# Patient Record
Sex: Female | Born: 1935 | Race: White | Hispanic: No | Smoking: Former smoker
Health system: Southern US, Community
[De-identification: ages and names within clinical notes are randomized; demographics above are authoritative.]

## PROBLEM LIST (undated history)

## (undated) DIAGNOSIS — C4492 Squamous cell carcinoma of skin, unspecified: Secondary | ICD-10-CM

## (undated) DIAGNOSIS — I701 Atherosclerosis of renal artery: Secondary | ICD-10-CM

## (undated) DIAGNOSIS — A412 Sepsis due to unspecified staphylococcus: Secondary | ICD-10-CM

## (undated) DIAGNOSIS — Z86711 Personal history of pulmonary embolism: Secondary | ICD-10-CM

## (undated) DIAGNOSIS — I38 Endocarditis, valve unspecified: Secondary | ICD-10-CM

## (undated) DIAGNOSIS — I272 Pulmonary hypertension, unspecified: Secondary | ICD-10-CM

## (undated) DIAGNOSIS — N189 Chronic kidney disease, unspecified: Secondary | ICD-10-CM

## (undated) DIAGNOSIS — I2510 Atherosclerotic heart disease of native coronary artery without angina pectoris: Secondary | ICD-10-CM

## (undated) DIAGNOSIS — E039 Hypothyroidism, unspecified: Secondary | ICD-10-CM

## (undated) DIAGNOSIS — I10 Essential (primary) hypertension: Secondary | ICD-10-CM

## (undated) DIAGNOSIS — E785 Hyperlipidemia, unspecified: Secondary | ICD-10-CM

## (undated) DIAGNOSIS — M353 Polymyalgia rheumatica: Secondary | ICD-10-CM

## (undated) DIAGNOSIS — I4891 Unspecified atrial fibrillation: Secondary | ICD-10-CM

## (undated) DIAGNOSIS — I5032 Chronic diastolic (congestive) heart failure: Secondary | ICD-10-CM

## (undated) HISTORY — DX: Atherosclerotic heart disease of native coronary artery without angina pectoris: I25.10

## (undated) HISTORY — DX: Pulmonary hypertension, unspecified: I27.20

## (undated) HISTORY — DX: Polymyalgia rheumatica: M35.3

## (undated) HISTORY — DX: Hyperlipidemia, unspecified: E78.5

## (undated) HISTORY — DX: Chronic diastolic (congestive) heart failure: I50.32

## (undated) HISTORY — DX: Chronic kidney disease, unspecified: N18.9

## (undated) HISTORY — DX: Endocarditis, valve unspecified: I38

## (undated) HISTORY — DX: Personal history of pulmonary embolism: Z86.711

## (undated) HISTORY — DX: Hypothyroidism, unspecified: E03.9

## (undated) HISTORY — DX: Atherosclerosis of renal artery: I70.1

## (undated) HISTORY — DX: Unspecified atrial fibrillation: I48.91

## (undated) HISTORY — DX: Sepsis due to unspecified Staphylococcus: A41.2

## (undated) HISTORY — DX: Squamous cell carcinoma of skin, unspecified: C44.92

## (undated) HISTORY — DX: Essential (primary) hypertension: I10

---
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
## 2005-03-01 ENCOUNTER — Emergency Department (HOSPITAL_COMMUNITY): Admission: EM | Admit: 2005-03-01 | Discharge: 2005-03-01 | Payer: Self-pay | Admitting: Emergency Medicine

## 2005-03-06 ENCOUNTER — Inpatient Hospital Stay (HOSPITAL_COMMUNITY): Admission: EM | Admit: 2005-03-06 | Discharge: 2005-04-16 | Payer: Self-pay | Admitting: Emergency Medicine

## 2005-03-07 ENCOUNTER — Encounter (INDEPENDENT_AMBULATORY_CARE_PROVIDER_SITE_OTHER): Payer: Self-pay | Admitting: Interventional Cardiology

## 2005-03-08 ENCOUNTER — Ambulatory Visit: Payer: Self-pay | Admitting: Internal Medicine

## 2005-03-09 ENCOUNTER — Ambulatory Visit: Payer: Self-pay | Admitting: Infectious Diseases

## 2005-03-13 ENCOUNTER — Ambulatory Visit: Payer: Self-pay | Admitting: Emergency Medicine

## 2005-04-29 ENCOUNTER — Emergency Department (HOSPITAL_COMMUNITY): Admission: EM | Admit: 2005-04-29 | Discharge: 2005-04-29 | Payer: Self-pay | Admitting: Emergency Medicine

## 2005-05-28 ENCOUNTER — Encounter: Admission: RE | Admit: 2005-05-28 | Discharge: 2005-05-28 | Payer: Self-pay | Admitting: Family Medicine

## 2005-06-04 ENCOUNTER — Ambulatory Visit: Payer: Self-pay | Admitting: Cardiology

## 2005-06-07 ENCOUNTER — Ambulatory Visit: Payer: Self-pay | Admitting: Cardiology

## 2005-06-22 ENCOUNTER — Ambulatory Visit: Payer: Self-pay | Admitting: Cardiology

## 2005-06-27 ENCOUNTER — Ambulatory Visit: Payer: Self-pay | Admitting: Cardiovascular Disease

## 2005-07-04 ENCOUNTER — Ambulatory Visit: Payer: Self-pay | Admitting: *Deleted

## 2005-07-11 ENCOUNTER — Ambulatory Visit: Payer: Self-pay | Admitting: Cardiology

## 2005-07-25 ENCOUNTER — Ambulatory Visit: Payer: Self-pay | Admitting: Cardiology

## 2005-08-08 ENCOUNTER — Ambulatory Visit: Payer: Self-pay | Admitting: Internal Medicine

## 2006-09-28 IMAGING — CR DG ABDOMEN ACUTE W/ 1V CHEST
3 series · 3 of 3 positions shown · non-contrast
Comparison: 03/15/05 chest and 03/16/05 abdomen.

CLINICAL DATA: Abdominal distention.  Sepsis.  Short of breath. 
 ACUTE ABDOMINAL SERIES:

[view not recorded (1 of 3)]
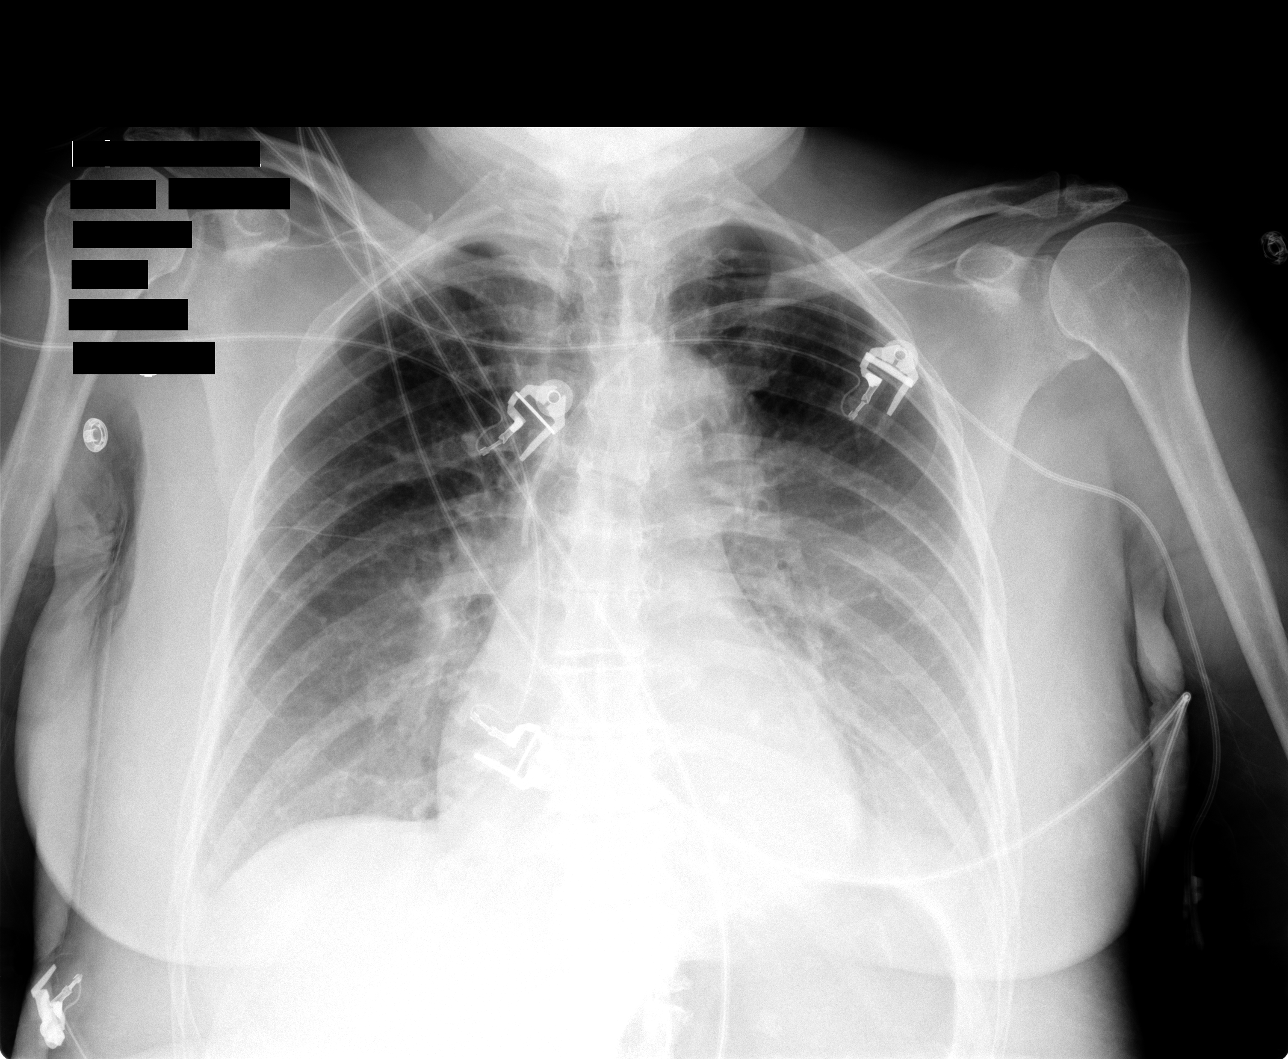

[view not recorded (2 of 3)]
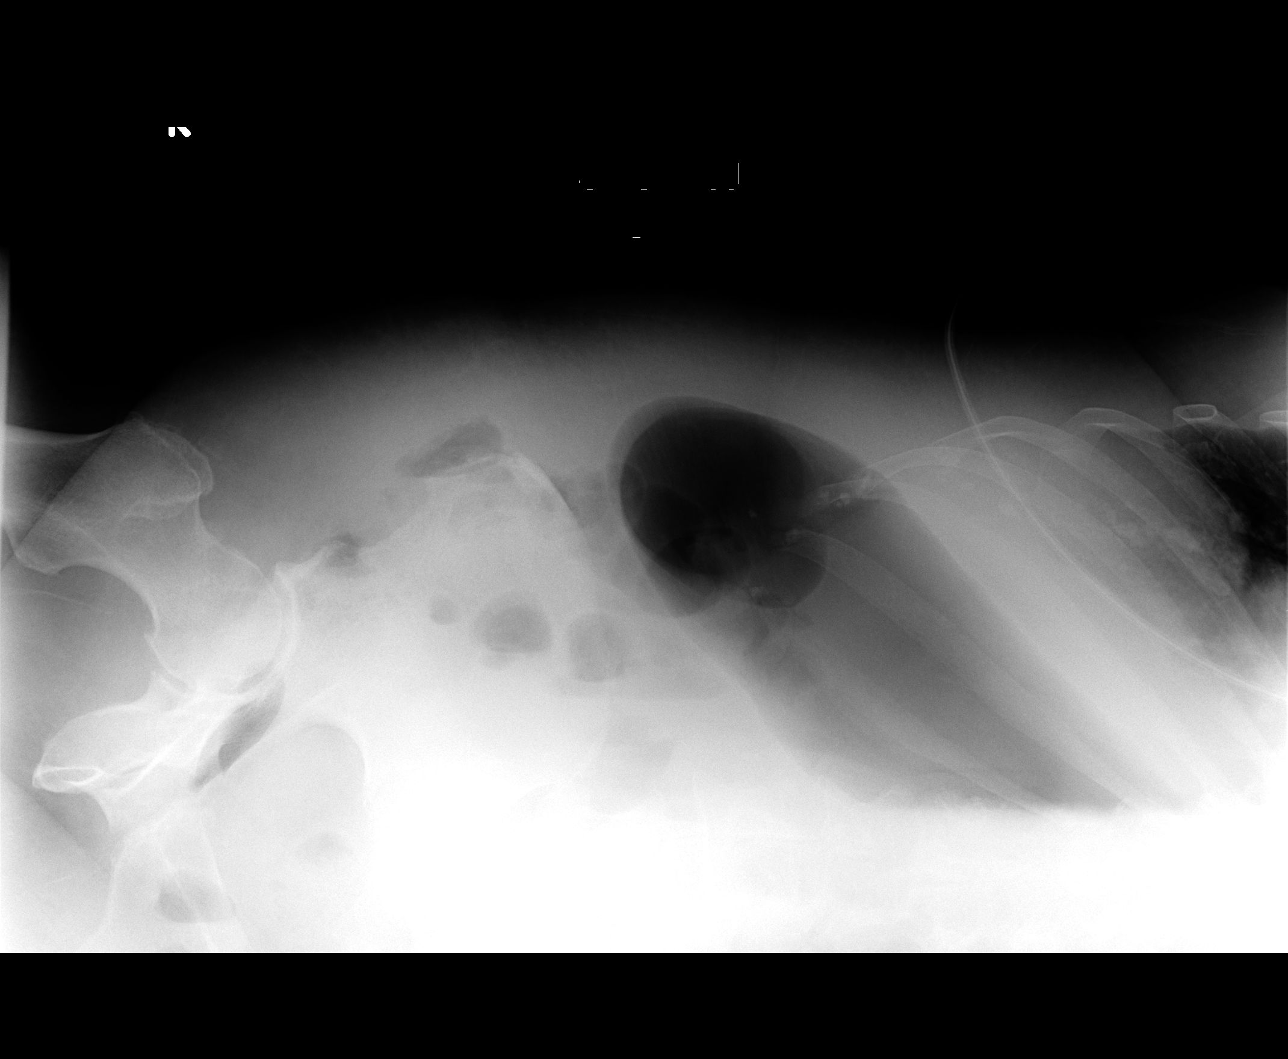

[view not recorded (3 of 3)]
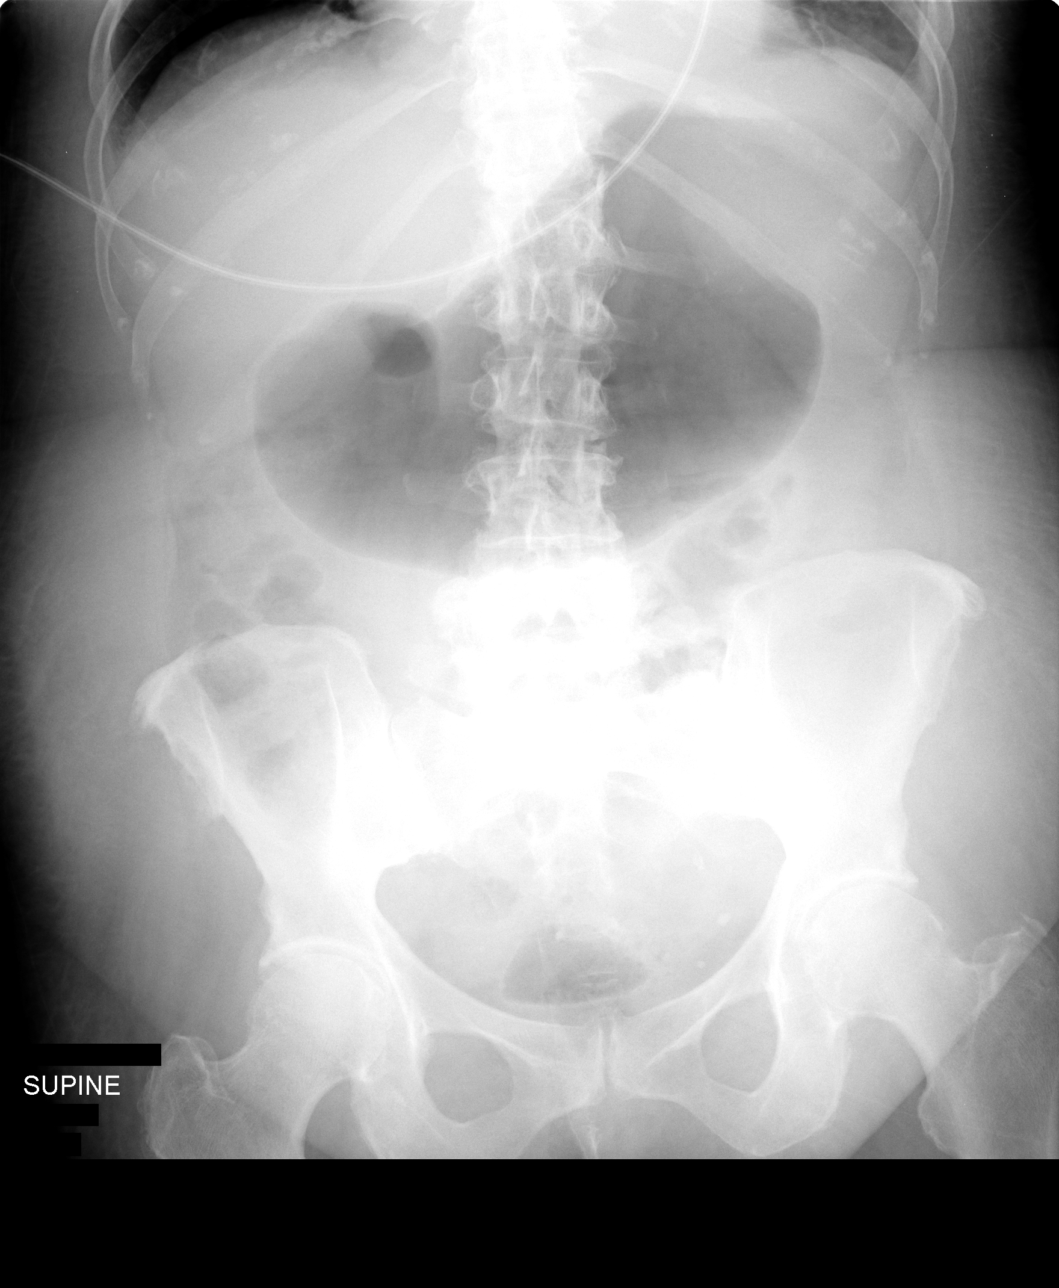

[3 of 3 positions shown; findings below may reference images not displayed]

Chest findings:    Portable view of the chest shows haziness at the bases most consistent with effusions, left greater than right.  Mild cardiomegaly is present.  Left central venous catheter remains with the tip in the SVC.  
 Abdomen findings:  Portable supine and left lateral decubitus films of the abdomen show some gaseous distention of the stomach.  No bowel obstruction is seen and no free air is noted.
IMPRESSION: 1.  Haziness at the lung bases consistent with effusions left greater than right.  Left central venous catheter tip remains in the SVC.  
 2.  No obstruction or free air.  Slight gaseous distention of the stomach.

## 2009-03-08 ENCOUNTER — Encounter: Admission: RE | Admit: 2009-03-08 | Discharge: 2009-03-08 | Payer: Self-pay | Admitting: Family Medicine

## 2010-07-28 NOTE — Consult Note (Signed)
NAMEChelesa Petersen, [name]                   ACCOUNT NO.:  [id number]   MEDICAL RECORD NO.:  [id number]          PATIENT TYPE:  INP   LOCATION:  0162                           FACILITY:   PHYSICIAN:  Shan Levans, M.D. [facility] OF BIRTH:  [birth year]   DATE OF CONSULTATION:  03/07/2005  DATE OF DISCHARGE:                                   CONSULTATION   CRITICAL CARE CONSULTATION:   PRIMARY CARE PHYSICIAN:  Sherin Quarry, MD   CHIEF COMPLAINT:  Severe sepsis.   HISTORY OF PRESENT ILLNESS:  This is an 77 year old white female admitted on  March 06, 2005 to the [facility] with complaints of since  the 20th of December, pain in the right scapular area.  The pain was felt as  though it was in the shoulder but much lower than that.  There were no other  joint pains, areas noted.  She progressed and went to the [facility]  on March 01, 2005 and had a temperature of 99.3 and was treated with a  nonsteroidal antiinflammatory drug and pain medicines but on the  nonsteroidal, no other documented fevers were persisting..  This was  continued until March 03, 2005.  The shoulder pain persisted.  She also  developed pain in both hips and was unable to walk and stand.  She had  progressive symptomatology.  On December 24, she noted blood in the urine.  She had been on chronic Coumadin therapy because of atrial fibrillation.  She actually had stopped the Coumadin since being on the anti-inflammatories  but nevertheless the pink coloration of the urine persisted.  Because of  continued malaise and failure to thrive, she was brought to the emergency  room on December 26 at 11:30 and was noted there to have a blood pressure of  130/75, atrial fibrillation, rapid rate at 130, saturations in the 99%  range.  Subsequent to this, she was found to have multiple laboratory  abnormalities and now a shock state which has persisted today on March 07, 2005 and on this basis, we were  asked to assist in this patient's care.   PAST MEDICAL HISTORY:  1.  History of chronic atrial fibrillation.  2.  History of coronary artery disease with stent placement in 2003.  3.  History of appendectomy.  4.  Tonsillectomy.  5.  Hysterectomy.  6.  Two joint replacements.   SOCIAL HISTORY:  She lives in [place].  She was up here visiting her  family for the holidays.  She does not smoke and she drinks an occasional 1  alcoholic beverage a day.   MEDICATION ALLERGIES:  1.  BACTRIM causes lip and face swelling.  2.  CIPRO causes dyspnea.  3.  CODEINE causes nausea.  4.  NORVASC causes swelling.  5.  MACROBID causes swelling.   CURRENT MEDICATIONS PRIOR TO ADMISSION:  Catapres, atenolol, Lasix,  Synthroid, aspirin, nitroglycerin, vitamin C, Coumadin, Lipitor.   FAMILY HISTORY:  Her father had multiple myeloma.  Her mother had heart  disease.   REVIEW OF SYSTEMS:  Otherwise  noncontributory.   PHYSICAL EXAMINATION:  This is an ill-appearing white female in no acute  distress wearing a face mask for oxygen.  VITAL SIGNS:  Temperature of 99, blood pressure was 104/70 on 5 mcg of  dopamine, saturation 96% on 50% Venturi mask.  CHEST:  Showed distant breath sounds with prolonged expiratory phase.  There  was no wheeze or rhonchi noted.  CARDIAC:  Exam showed resting tachycardia, irregularly irregular rhythm  without S3.  Normal S1 and S2.  ABDOMEN:  Was distended.  Bowel sounds hypoactive.  EXTREMITIES:  Were warm with decreased perfusion.  No edema.  PULSES:  2+ and symmetric.  There were no bruits.  SKIN:  Was otherwise clear.  NECK:  Was supple.  No jugular venous distention noted.  No thyromegaly.  HEENT:  Oropharynx was clear.  Nares were clear.   LABORATORY DATA:  BNP of 1540.  CK-MB 7.9, troponin I 0.05.  Liver function  profile:  Bilirubin 4.9, total 3.1 direct, alkaline phosphatase 271, SGOT  98, SGPT 73.  Albumin 2.  White count 21,800 with hemoglobin 11.6,  platelet  count is 63,000.  PTT 75, PT 40.9, INR 4.3.  This is at 4:43 a.m. this  morning.  BMET:  Sodium 128, potassium 5, chloride 96, CO2 at 20, blood  sugar 76, BUN 86, creatinine 2.5, chloride was 96.  Blood cultures on  December 26 are positive for gram-positive cocci in clusters.  Urine culture  on 03/06/2005 is pending.  Uric acid is 7.5.   Chest x-ray on December 26 showed left lower lobe atelectasis.  Abdominal  ultrasound on December 26, showed folds in the gallbladder.  No gallstones.  Gallbladder wall:  No abnormalities in the gallbladder or other organs seen.   IMPRESSION:  1.  Is that of severe sepsis with multisystem organ failure with organs      involved including disseminated vascular coagulation with decreased      platelet count, acute renal failure exacerbated by ACE (angiotensin-      converting enzyme) inhibitor use, increased liver function profile with      hepatic injury, shock, and mild acute lung injury, left lower lobe      atelectasis with positive blood cultures for gram-positive cocci in      clusters.  2.  Atrial fibrillation with rapid ventricular response.  3.  Polyarthralgias with an increased sedimentation rate.  4.  Allergies to sulfa.   RECOMMENDATIONS:  1.  Place on septic shock protocol.  2.  Place central line.  3.  Place arterial line.  4.  Correct INR to less than 3.  5.  Initiate Xigris.  6.  Titrate pressors using Levophed.  7.  Follow up lab data.  8.  Transfer to critical care service.      Shan Levans, M.D. [facility]  Electronically Signed     PW/MEDQ  D:  03/07/2005  T:  03/07/2005  Job:  [id number]   cc:   Sherin Quarry, MD

## 2010-07-28 NOTE — Discharge Summary (Signed)
NAMEAtalaya Petersen, Janice Petersen                   ACCOUNT NO.:  [id number]   MEDICAL RECORD NO.:  [id number]          Petersen TYPE:  INP   LOCATION:  1401                         FACILITY:  [facility]   PHYSICIAN:  Hollice Espy, M.D.DATE OF BIRTH:  1915-12-29   DATE OF ADMISSION:  03/06/2005  DATE OF DISCHARGE:                                 DISCHARGE SUMMARY   This discharge summary will span events covering from date of admission,  March 06, 2005, to today, April 09, 2005.   CONSULTATIONS:  1.  Shan Levans, M.D., Critical Care Medicine.  2.  Fransisco Hertz, M.D., Infectious Disease.   HISTORY OF PRESENT ILLNESS:  Janice Petersen is an 77 year old white female who is  originally from [place] who visited family in [place] as of February 23, 2005. Janice Petersen normally is well and has a history of hypothyroidism,  hypertension and atrial fibrillation on chronic anticoagulation therapy. Janice Petersen  initially started having problems on February 28, 2005 when Janice Petersen complained  of pain in Janice right scapular area. Initially Janice Petersen was suppose to be  in her right shoulder but moving down her arm. Janice Petersen was initially treated  with a nonsteroidal antiinflammatory medication plus narcotics [facility]. This was started on December 21 until December 23. Janice Petersen  in her shoulder persisted and since Janice Petersen started to have pain in both her  hips leading to painful to stand or walk, Janice Petersen [facility] and was noted to have a temperature of 99.8 and only had  one previous episode of chills. Again Janice Petersen received a nonsteroidal, Janice Petersen had  no other symptoms. After that, Janice Petersen started complaining of ankle and  knee pain and on December 26 noted Janice presence of blood in her urine. Janice Petersen  had been on chronic Coumadin for atrial fibrillation since 2003 and her last  PT/INR was checked 3 weeks ago. Janice Petersen's pain in joints persisted and  Janice Petersen presented to Janice  emergency room on March 06, 2005. At that  time, Janice Petersen was found to be in rapid A fib with a heart rate of 110 to 130,  blood pressure of 135/75. Other labs were checked and Janice Petersen was noted  to have a BNP of 975 but also a creatinine of 2.6. Her BUN was elevated at  85. Other labs of note were AST of 102, ALT of 83, alkaline phosphatase of  310 and a total bilirubin of 3.7. Her INR was elevated at 11.9. Janice Petersen  was admitted for multisystem disorder with findings of diffuse arthritis,  elevated sedimentation rate, elevated INR, uncontrolled A fib and liver  dysfunction with biliary pattern as well as acute renal failure. Janice Petersen  was admitted on IV antibiotics and started on IV fluids and antibiotics. By  hospital day 2, Janice Petersen was noted to be hypotensive and in a shocked  state. Dr. Danise Mina from Critical Care Medicine was consulted for  assistance. Janice Petersen's BNP at this point had elevated to 1540, her  bilirubin  increased to 4.9, her white count was at 21.8 with a platelet  count of 63,000. Blood sugar was 76 and blood cultures which had been done  Janice day prior were positive for Gram positive cocci and clusters. Chest x-  ray on Janice Petersen's initial admission showed left lower lobe atelectasis.  Abdominal ultrasound on December 26 showed a normal gallbladder, no  gallstones and no abnormalities in Janice abdominal wall. Janice findings was that  Janice Petersen likely had severe sepsis with multisystem organ failure with  organs involving disseminated vascular coagulation with decreased platelet  count, acute renal failure exacerbated by her ACE inhibitor use, increased  liver function secondary to shocked liver and mild acute lung injury, left  lower lobe atelectasis, positive blood cultures for Gram positive cocci and  clusters contributing to her atrial fibrillation. In addition, to complicate  matters further, Janice Petersen was noted to have multiple drug antibiotic   allergies including SULFA, FLUOROQUINOLONES, and MACROBID, all of which  could cause short of breath and swelling. Dr. Delford Field, after evaluation of  Janice Petersen, started her on a septic shock protocol including placing a  central and arterial line, initiating Xigris and starting her on pressors.  At this point, Janice Petersen was taken over onto [facility] Critical Care Service.  Over Janice next several days, Janice Petersen started to stabilize. Her atrial  fibrillation started to decrease with beta blockers. In addition, her INR  had come down to 1.5 following doses of vitamin K. Her creatinine from 2.1  down to 1.7. Her white count stayed elevated at 28. Janice Petersen showed signs  of DIC with septic coagulopathy. Infectious disease was consulted on  December 29 for her staph sepsis. At this point, Janice Petersen was on  vancomycin and Zosyn. Janice etiology of Janice bacteremia appeared to be unclear.  It is possible endocarditis was suspected in this Petersen but Janice Petersen did not  have a portal of entry. Over Janice next several days, Janice Petersen continued to  be followed. Her labs all continued to improve except for her bilirubin  which continued to trend up. By December 30, it was 6.1. In addition, Janice  family noted Janice Petersen had been exposed to boils from her daughter. A  positive bacteremia without boils in this Petersen would be unusual but not  unheard of in terms of ID. They recommended continue vancomycin and planning  for a transesophageal echo. By December 31, Janice Petersen was still on  pressors and Xigris protocol. At this point, blood cultures are positive for  MRSA and Janice Petersen was continued on vancomycin. There was a question as to joint  pain if Janice bacteremia had seated her artificial joint. This will continue  to be followed. Janice Petersen's culture and sensitivities were noted for  community acquired MRSA but given how I recommended continuing her as per infectious disease protocol by March 12, 2005, continuing  Zosyn to day 10  and felt it could still be possible that Janice Petersen had seated prosthetics. Janice  Petersen on January 2 if evaluated by nutrition who at this point was on a  clear liquid diet. They recommended advancing to full liquids and advancing  her as tolerated. Janice Petersen continued to show signs of improvement. On  March 16, 2005, Janice Petersen started having episodes of diarrhea. Janice C Dif cultures  were sent. Janice plan for this Petersen was to continue on a total of 42 days  of vancomycin given history of prosthesis and potential for seating.  By  January 5 as well, Janice Petersen had significantly improved, her white count  had finally normalized down to 9.7, INR was down to 2.4 by pulmonary  Critical Care as planned. A TEE or orthopedic consult would not needed since  Janice Petersen would require antibiotics long-term which would account for either. By  March 19, 2005, Janice Petersen had completed 14 days of vancomycin. Janice Petersen  continued to make slow progress. Janice Petersen had poor p.o. intake on full liquids  and Janice Petersen was continued secondary to poor p.o.  Her C Dif cultures were  negative and at this point pulmonary critical care medicine felt Janice Petersen  was stable and was transferred to [facility] service who picked up  Janice Petersen on March 20, 2005. At this point, Janice Petersen was noted to be  slightly hypertensive with a blood pressure of 160/70. Janice Petersen was started on  clonidine when pressors were completely weaned off. Janice Petersen had also  been complaining of shortness of breath x1 day. Review of Janice previous chest  x-ray on January 4 showed an increased left effusion. By March 21, 2005,  Janice Petersen was showing small pinpoint wounds on Janice right and left lower  extremities. Wound care was consulted and Janice Petersen was noted to have some  palpable edema. This was continued to be followed and by January 11, Janice  Petersen was noted to have worsening rash with red raised blister like rash  on her back, trunk, chest  and underneath her arms which was advancing. I  suspected that perhaps it could be yeast infection and Janice Petersen was  started on oral Diflucan and monitored. At this point, Janice Petersen had completed  17/42 days of antibiotics. Followup labs on January 12 shows continued rash.  By January 13, Janice Petersen's white count had slowly increased from 8.9 up to  10.9. On review of her differential, her neutrophils were noted to be  increased, Janice eosinophils were negative. It was felt that possibly this  could be drug related and Lasix and Ultram were discontinued. A dermatology  consult was considered, Janice Petersen could not be found to have any other  reasons for this. On January 14, Janice Petersen's rash started to improve but  on January 15, Janice Petersen had a much more worsening area of erythroderma and more  confluence of Janice rash of Janice right arm and neck and full back involvement.  In addition, her white count had increased to 15.1. Her INR also was slightly subtherapeutic at 1.4. Also of note was that Janice Petersen had an  abnormal mammogram which was reported by her family but this issue was not  addressed at this time. This was done at [facility]. Janice Petersen had an abnormal mammogram  and they recommended further followup. It was ID's followup on March 27, 2005 that suspected Janice Petersen's erythroderma and bullous condition could  be secondary to IgM deposition from vancomycin. They recommending  discontinuing Janice vancomycin and starting her on Cubicin to complete her  antibiotic course. This was done on March 27, 2005. Over Janice next several  days, Janice Petersen remained stable and by January 19, Janice Petersen herself  felt that Janice rash was getting better. On January 21, it was noted that Janice  Petersen's PICC line in Janice left upper extremity which Janice Petersen had received soon  after admission was requiring frequent dressing changes due to generalized  edema  and weeping. There some  concern that this could be purulent. It was  discontinued and cultures were sent which only grew out 40 and 60 colonies  of E. coli and pseudomonas eventually. I discussed this with infectious  disease who recommended that no additional antibiotics would be needed for  this. By January 22, Janice Petersen was doing well but complaining of mouth  pain. Janice Petersen did not show any signs of thrush and it was expected perhaps this  was more skin sloughing secondary to Janice vancomycin skin reaction. By  January 23, Janice Petersen was continuing to have some mouth pain. Lidocaine  topical cream was placed on Janice Petersen's tongue which Janice Petersen said greatly  improved her symptoms and Janice Petersen was better able to tolerate p.o. In addition,  her rash continued to improve and Janice Petersen remained stable. On January 25, Janice  Petersen looked to be having some increased rhonchi and productive cough. Janice Petersen  denied any fevers or shortness of breath. A chest x-ray and ABG was ordered.  Janice chest x-ray actually looked normal with no signs of any new pneumonia or  atelectasis. Janice Petersen had no fevers; however, an ABG was ordered and Janice Petersen  noted to be somewhat hypoxic. Janice concern was whether or not Janice Petersen had  a DVT and pulmonary embolus. Lower extremity Doppler's were done which  showed no evidence of any DVT, however, a CT was done which showed a  pulmonary embolus in Janice segmental pulmonary artery of Janice right lower lobe.  However, given signs of this, it appeared to be more consistent with a  subacute or chronic finding. In addition, scattered markins and an opacity  of Janice right upper lobe suggested a possible bronchopneumonia. Following  administration of oxygen, Janice Petersen began to fill markedly better and it  is a concern that perhaps Janice Petersen is having some volume overload. A BNP was  checked and Janice Petersen was found to be elevated to 90. A dose of Lasix was  given and Janice Petersen was markedly better, her  lungs were clear. By January 28, Janice Petersen was doing well overall, there was still some minor oral pain  but overall her skin, Janice Petersen felt, was better. Janice Petersen was tolerating food and  drink well. It was noted that because Janice Petersen's DIC initial  supratherapeutic INR that her Coumadin had been held. When Janice Petersen had been  loaded with digoxin Janice Petersen had come down to a normal sinus rhythm state.  Currently Janice Petersen is on day 34 of 42 for daptomycin and remains stable.  Janice Petersen will continue at least 8 more days of antibiotics.   DISCHARGE DIAGNOSES:  1.  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  2.  MRSA infection possibly from exposure to family members infected boils.  3.  Atrial fibrillation now in normal sinus rhythm. A question had bee      whether or not to continue Janice Petersen on anticoagulation at this point.      In addition knowing her recently subacute found clot likely which      occurred earlier on in her hospitalization would favor a re-attempt at      trying Coumadin therapy.  4.  Constipation. Will work at attempting at having Janice Petersen move her      bowels.  5.  Hypothyroidism. Janice Petersen is continued on her Synthroid.  6.  Abnormal mammogram which needs to be  followed up as an outpatient.  7.  Hypoxia. Could be secondary to volume overload which is stable.  8.  Bronchopneumonia. Will check a CBC and chest x-ray on April 10, 2005.  9.  Reported chronic pulmonary embolus seen on CT. Would favor for now      restarting her Coumadin while in Janice hospital to see if Janice Petersen tolerates      this.  10. Hypertension. Janice Petersen is back on her p.o. meds.      Hollice Espy, M.D.  Electronically Signed     SKK/MEDQ  D:  04/09/2005  T:  04/10/2005  Job:  [id number]

## 2010-07-28 NOTE — Discharge Summary (Signed)
NAME:  Janice Petersen, Janice Petersen                   ACCOUNT NO.:  [id number]   MEDICAL RECORD NO.:  [id number]          PATIENT TYPE:  INP   LOCATION:  1401                         FACILITY[facility]   PHYSICIAN:  Jackie Plum, M.D.DATE OF BIRTH:  24-Jan-1916   DATE OF ADMISSION:  03/06/2005  DATE OF DISCHARGE:                                 DISCHARGE SUMMARY   ADDENDUM   Over the last while the patient has been waiting for placement and she has  accepted a bed at [facility] today. She has left elbow cellulitis which is  being treated with antibiotics.   The patient is going to be discharged on:  1.  Keflex 500 mg p.o. b.i.d.  2.  Catapres 0.2 mg p.o. b.i.d.  3.  Digoxin 0.125 mg p.o. daily.  4.  Ensure 230 mL p.o. t.i.d.  5.  Ferrous sulfate 325 mg p.o. t.i.d.  6.  Levothyroxine 50 mcg daily.  7.  Multivitamins one tablet daily.  8.  Protonix 40 mg daily.  9.  MiraLax 17 g p.o. daily.  10. Potassium chloride 40 mEq daily.  11. Protein supplement 6 g p.o. t.i.d.  12. Warfarin 0.5 mg p.o. q.h.s. (the patient will need INR and protime      checked in about 3 days).  13. Furosemide 20 mg daily.  14. Tylenol 650 mg p.o. q.4h. p.r.n.  15. Albuterol 2.5 mg nebulizer q.4h. p.r.n.  16. Guaifenesin 15 mL p.o. q.6h. p.r.n.   On rounds today the patient feels fine. Denies any fever or chills. She is  alert and oriented x3. Her mucous membranes are moist. No evidence of  clinical dehydration. Her vital signs are stable with discharge blood  pressure of 123/59, pulse 68, temperature 97.0, respirations 20. Pulmonary  auscultation reveals coarse breath sounds without any rhonchi or wheezes.  Cardiac:  The patient was not tachycardic and no gallop. Abdomen:  Soft,  bowel sounds present. Extremities:  No cyanosis. She has erythema in the  posterior aspect of her left elbow, an area of about 3 cm x 4 cm.   The patient will be discharged to [facility] to continue restorative  rehabilitation treatment  and antibiotics for treatment of her cellulitis.  She will need continued management of her INR. Her INR today was therapeutic  at 2.6 with protime of 27.7.      Jackie Plum, M.D.  Electronically Signed     GO/MEDQ  D:  04/16/2005  T:  04/16/2005  Job:  [id number]

## 2010-07-28 NOTE — H&P (Signed)
NAMEYanely Petersen, [name]                   ACCOUNT NO.:  [id number]   MEDICAL RECORD NO.:  [id number]          PATIENT TYPE:  INP   LOCATION:  0104                         FACILITY:  [facility]   PHYSICIAN:  Sherin Quarry, MD      DATE OF BIRTH:  17-Apr-1915   DATE OF ADMISSION:  03/06/2005  DATE OF DISCHARGE:                                HISTORY & PHYSICAL   HISTORY OF PRESENT ILLNESS:  Janice Petersen is an 77 year old lady who according  to family members is generally in good health. She is usually independent  and is able to take care of herself without difficulty and participate in  activities such as painting.  She normally resides in [facility].  According to family members, she came down to visit them on February 23, 2005.  She was doing well until February 28, 2005 when she began complaining  of pain in the right scapular area. The pain felt as though it was in her  shoulder, but it was clearly lower than that, and moving her arm did not  seem to duplicate the pain.  This pain gradually resolved, but then she  began to develop pain in her left shoulder. This was noticeable the next  day.  She, therefore, went to [facility] where she was treated  with a nonsteroidal anti-inflammatory drug and pain medication.  This was  started on March 01, 2005 and was continued until March 03, 2005.  Subsequently, the pain in her left shoulder area persisted, and, in  addition, she developed a pain in both hips.  It hurt to stand.  It was  painful for her to walk.  When she presented to the [facility],  she was noted to have a temperature of 99.8. She had one episode of chills  at that time. There has been no documented fever since that time, but the  patient did the nonsteroidal anti-inflammatory drug.  At that time, she was  not experiencing any headache, breathing difficulty, coughing, abdominal  pain, nausea, vomiting or dysuria. Subsequently, she said that her ankles  and  knees began hurting.  On March 04, 2005, she noted the presence of  blood in the urine.  The urine appeared somewhat pink.  She states that she  is on chronic Coumadin therapy, I believe because of atrial fibrillation.  She normally takes Coumadin 2-1/2 milligrams three times weekly and 1.25  milligrams the other days.  She has been taking Coumadin since 2003. Her  last prothrombin time was measured approximately 3 weeks ago, and at that  time the doctor was satisfied with the results and did not change her  Coumadin dosage.  The pain in the joints and malaise persisted and,  therefore, the patient presented to the [facility] emergency room today,  March 06, 2005 at 11:30. There, she was noted a blood pressure 135/75.  In the emergency room, her heart rate has fluctuated between 110 and 130  with a heart rhythm consistent with rapid atrial fibrillation.  O2  saturation has been 99%.  Subsequent laboratory studies obtained have included a urinalysis which was  basically remarkable for the presence of leukocyte esterase, a BNP of 975.  The sodium was 126, potassium 5.1, chloride 94, CO2 of 21, creatinine was  2.6, BUN was 85, glucose was 65, calcium was 8.9, albumin was 2.3. AST was  102, ALT 83, alkaline phosphatase was 310, with a total bilirubin 3.7. The  patient's prothrombin time was indicated to be greater than 11.9.   The patient is admitted at this time for management of these multisystem  problems.   PAST MEDICAL HISTORY:   CURRENT MEDICATIONS:  1.  Catapres 0.15 milligrams twice daily.  2.  Atenolol 100 milligrams twice daily.  3.  Lasix 20 milligrams twice daily.  4.  Synthroid 50 mcg daily.  5.  Aspirin 84 milligrams daily.  6.  Nitroglycerin p.r.n.  7.  Vitamin C.  8.  Coumadin which she takes 2.5 alternating with 1.25 milligrams daily.  9.  Lipitor 40 milligrams daily.  10. As previously mentioned, she was taking a pain medicine and nonsteroidal       anti-inflammatory drug for 2 days.   ALLERGIES:  1.  BACTRIM which has caused lip and face swelling.  2.  CIPRO which has caused shortness of breath.  3.  CODEINE which has caused nausea.  4.  NORVASC which has caused swelling.  5.  MACROBID which has also caused swelling.   OPERATIONS:  1.  She has had a previous heart stent in 2003 which was discovered on the      basis of a stress test apparently.  This procedure apparently did well.      Sometime during the post procedure, she was apparently found to be in      atrial fibrillation, and I believe this is why she is taking Coumadin.      There have been no subsequent cardiac problems.  2.  Appendectomy.  3.  Tonsillectomy.  4.  Hysterectomy.  5.  Two thumb joint replacements.  6.  She has had an echocardiogram in the past, but she does not know the      results.  (She does not think that she has had any history of stroke, lung problems,  kidney problems or stomach problems.)   FAMILY HISTORY:  She has two sisters who are living and whom she does not  think have any significant health problems. Her parents are deceased. Her  father died as result of multiple myeloma.  Her mother had a heart problem.   SOCIAL HISTORY:  She does not smoke. She drinks one alcoholic beverage per  day.   REVIEW OF SYSTEMS:  HEAD:  She denies headache or dizziness. EYES:  She  denies visual blurring or diplopia.  EARS, NOSE, THROAT:  Denies earache,  sinus pain or sore throat. CHEST:  She indicates that she feels like she  cannot take a deep breath. She does not feel especially short of breath. She  is not having any chest pain.  There has been no orthopnea or PND.  GI: She  denies nausea, vomiting, abdominal pain, melena or hematochezia. GU: As  previously noted, she had an episode of red urine. There has been no  dysuria. NEUROLOGIC:  There has been no history of seizure or stroke. ENDOCRINE:  She denies excessive thirst, urinary frequency or  nocturia.   PHYSICAL EXAMINATION:  HEENT:  Within normal limits.  CHEST:  Bibasilar rales.  CARDIOVASCULAR:  Reveals an irregularly irregular heart  rhythm consistent  with atrial fibrillation.  Heart rate is about 120 to 125.  ABDOMEN:  Somewhat distended. It is nontender. Bowel sounds were present. There is no  guarding or rebound.  NEUROLOGIC:  Neurologic testing and examination of extremities is remarkable  for extremities which reveal evidence of tiny petechiae on the lower  extremities.  Noteworthy is as of the time I am dictating this note, the  patient has not as yet had a CBC performed.  This result is pending, but the  CBC has been drawn.   IMPRESSION:  Multisystem disorder of uncertain etiology with findings of  diffuse arthritis, elevated sedimentation rate, fever markedly prolonged  INR, uncontrolled atrial fibrillation and liver dysfunction with a biliary  pattern with elevated alkaline phosphatase and elevated total bilirubin.  Also noteworthy is the patient's significant renal dysfunction. Will admit  her to the ICU and proceed to further evaluate these symptoms by obtaining  serial blood cultures.  I will place her on empiric antibiotic therapy with  vancomycin and Zosyn after blood cultures are obtained. Will give her  vitamin K to correct the abnormal INR.  A CBC and platelet count will be  obtained as soon  as possible.  An ultrasound will be obtained of the liver.  The patient's  pain will be controlled, and will try to control heart rate with p.o.  atenolol and IV Cardizem.  All medicines that might be potentially  nephrotoxic will be discontinued.  We will follow her very closely.           ______________________________  Sherin Quarry, MD     SY/MEDQ  D:  03/06/2005  T:  03/06/2005  Job:  [id number]

## 2010-12-26 ENCOUNTER — Encounter: Payer: Self-pay | Admitting: Cardiology

## 2011-01-22 ENCOUNTER — Encounter: Payer: Self-pay | Admitting: Cardiology

## 2011-02-19 ENCOUNTER — Encounter: Payer: Self-pay | Admitting: Cardiology

## 2011-04-18 ENCOUNTER — Ambulatory Visit (INDEPENDENT_AMBULATORY_CARE_PROVIDER_SITE_OTHER): Payer: Medicare Other | Admitting: *Deleted

## 2011-04-18 ENCOUNTER — Encounter: Payer: Self-pay | Admitting: Cardiology

## 2011-04-18 ENCOUNTER — Ambulatory Visit (INDEPENDENT_AMBULATORY_CARE_PROVIDER_SITE_OTHER): Payer: Medicare Other | Admitting: Cardiology

## 2011-04-18 DIAGNOSIS — I509 Heart failure, unspecified: Secondary | ICD-10-CM

## 2011-04-18 DIAGNOSIS — I4891 Unspecified atrial fibrillation: Secondary | ICD-10-CM

## 2011-04-18 DIAGNOSIS — I10 Essential (primary) hypertension: Secondary | ICD-10-CM | POA: Insufficient documentation

## 2011-04-18 DIAGNOSIS — R0602 Shortness of breath: Secondary | ICD-10-CM

## 2011-04-18 DIAGNOSIS — I5032 Chronic diastolic (congestive) heart failure: Secondary | ICD-10-CM | POA: Insufficient documentation

## 2011-04-18 DIAGNOSIS — I2510 Atherosclerotic heart disease of native coronary artery without angina pectoris: Secondary | ICD-10-CM

## 2011-04-18 DIAGNOSIS — I2789 Other specified pulmonary heart diseases: Secondary | ICD-10-CM

## 2011-04-18 DIAGNOSIS — I272 Pulmonary hypertension, unspecified: Secondary | ICD-10-CM

## 2011-04-18 DIAGNOSIS — E785 Hyperlipidemia, unspecified: Secondary | ICD-10-CM

## 2011-04-18 LAB — BASIC METABOLIC PANEL
Chloride: 103 mEq/L (ref 96–112)
GFR: 47.66 mL/min — ABNORMAL LOW (ref >60.00)
Glucose, Bld: 92 mg/dL (ref 70–99)
Sodium: 141 mEq/L (ref 135–145)

## 2011-04-18 LAB — POCT INR: INR: 1.5

## 2011-04-18 MED ORDER — FUROSEMIDE 40 MG PO TABS: ORAL_TABLET | ORAL | Status: DC

## 2011-04-18 MED ORDER — ISOSORBIDE MONONITRATE ER 30 MG PO TB24: 30.0000 mg | ORAL_TABLET | Freq: Every day | ORAL | Status: DC

## 2011-04-18 MED ORDER — POTASSIUM CHLORIDE CRYS ER 20 MEQ PO TBCR: 20.0000 meq | EXTENDED_RELEASE_TABLET | Freq: Two times a day (BID) | ORAL | Status: DC

## 2011-04-18 NOTE — Assessment & Plan Note (Signed)
BP running high today.  Increasing Lasix as above could help some.  Will continue to monitor, may need to adjust regimen further.

## 2011-04-18 NOTE — Assessment & Plan Note (Signed)
Stable with no chest pain.  Continue warfarin (no indication to add ASA), statin, beta blocker, ACEI.  

## 2011-04-18 NOTE — Assessment & Plan Note (Signed)
Goal LDL < 70.  Will check lipids.  

## 2011-04-18 NOTE — Assessment & Plan Note (Signed)
Probably secondary to diastolic LV failure with elevated LA pressure and reactive pulmonary artery changes.  

## 2011-04-18 NOTE — Assessment & Plan Note (Signed)
Patient is volume overloaded on exam with elevated JVP and peripheral edema.  She is on home oxygen, presumably because of heart failure as she does not have a COPD diagnosis and quit smoking many years ago.  NYHA class III symptoms.  She has significant valvular disease with moderate to severe MR and TR.  She has severe pulmonary hypertension which I suspect is secondary to elevated left atrial pressure as well as some component of reactive pulmonary vascular changes.  She needs more diuresis, but given the valvular disease I suspect that this problem may be difficult to manage.  She is not a candidate for cardiac surgery to repair the mitral valve.  - Increase Lasix to 40 mg bid. I will add KCl 20 mEq daily.  - Check BMET/BNP today and in 2 wks.  - I would like to see her back in 3 wks - Stop isordil which she has been taking once daily and start Imdur 30 mg daily for longer-acting effect.

## 2011-04-18 NOTE — Assessment & Plan Note (Addendum)
Chronic atrial fibrillation.  Good rate control.  She is on atenolol 50 mg bid.  I am going to check a BMET today.  If creatinine clearance is significantly abnormal, I will probably take her off atenolol and have her use Coreg instead. We will monitor INR for warfarin here, check today.

## 2011-04-18 NOTE — Patient Instructions (Addendum)
Increase lasix(furosemide) to 40mg  twice a day. Take 1 tablet in the morning and take 1 tablet in the afternoon about 4-5PM.  Stop isosorbide dinitrate.  Start Imdur(isosorbide mononitrate) 30mg  daily.  Start KCL (potassium) 20 mEq daily.  Your physician recommends that you have  lab work today--BMET/BNP  Your physician recommends that you return for fasting lab work in: 2 weeks--BMET/BNP/Lipid profile  The coumadin clinic will see you today to check your pro-time. They will let you know when you need to come back for your next pro-time.  Your physician recommends that you schedule a follow-up appointment in: 3 weeks with Dr Shirlee Latch.

## 2011-04-18 NOTE — Progress Notes (Signed)
78 yo with history of diastolic CHF, valvular heart disease, CAD, and chronic atrial fibrillation presents for cardiology evaluation.  She used to live in [facility] and was followed by a cardiologist down there.  She has recently moved to [facility] to be nearer her family (currently living with one of her sons).  She had a stent placed in her RCA in 2004.  She has had a long history of valvular disease, last echo showed moderate to severe MR and moderate to severe TR with severe pulmonary hypertension.  She has a history of diastolic CHF.  She has been in atrial fibrillation since 2003 on warfarin.  About 2 months ago, Janice Petersen developed worsening exertional dyspnea.  She was seen by her PCP in [facility] and started on home oxygen.  She has been taking Lasix 40 mg daily.  She also developed worsening lower extremity swelling with venous stasis changes.  Prior to this, she was actually walking 1/2 mile/day.  Since starting oxygen, she has felt better.  She is able to walk short distances in her son's house without dyspnea.  She is short of breath when she walks up the steps.  No orthopnea or PND.  She thinks she has gained about 6 lbs over the last couple of months.  No chest pain.  HR is well-controlled.  BP is high today.  She says that BP has fluctuated a lot over the last couple of years.  She is on multiple antihypertensive agents.    ECG: atrial fibrillation, RBBB  PMH: 1. Diastolic CHF: Echo (10/12) with EF 65%, mild AI, moderate to severe MR, moderate to severe TR, PA systolic pressure 84 mmHg.  She has been on home oxygen since 1/13.  2. Chronic atrial fibrillation since 2003. 3. PMR 4. H/o appendectomy 5. TAH 6. Renal artery stenosis: 50% left renal artery stenosis found on angiogram in 8/02.  7. Hypothyroidism 8. MRSA sepsis in 2006 9. Hyperlipidemia 10. H/o PE 11. CAD: Cypher DES to proximal RCA in 2/04.  Last Lexiscan myoview in 2011 showed EF 67%, normal.  12. Valvular heart  disease: Last echo in 10/12 showed moderate to severe MR, moderate to severe TR.  13. Pulmonary hypertension: Suspect secondary to elevated left atrial pressure as well as probable reactive pulmonary vascular changes.   SH: Widow.  Recently moved from [facility] to [place] and living with her son.  Has 3 sons.  Former smoker (quit years ago).  Rare ETOH.   FH: Mother with CHF at 78, father with multiple myeloma.   ROS: All systems reviewed and negative except as per HPI.   Current Outpatient Prescriptions  Medication Sig Dispense Refill  . atenolol (TENORMIN) 50 MG tablet Take 50 mg by mouth 2 (two) times daily.      . Calcium Carbonate-Vitamin D (CALCIUM 500 + D PO) Take by mouth daily.      . cloNIDine (CATAPRES) 0.2 MG tablet Take 0.2 mg by mouth 2 (two) times daily.      Marland Kitchen levothyroxine (SYNTHROID, LEVOTHROID) 100 MCG tablet Take 100 mcg by mouth daily. 1/2 tab daily      . lisinopril (PRINIVIL,ZESTRIL) 20 MG tablet Take 20 mg by mouth 2 (two) times daily.      Marland Kitchen lovastatin (MEVACOR) 20 MG tablet Take 20 mg by mouth at bedtime.      . Multiple Vitamins-Minerals (CENTRUM SILVER PO) Take by mouth daily.      Marland Kitchen warfarin (COUMADIN) 2 MG tablet Take 2  mg by mouth as directed.      Marland Kitchen DISCONTD: furosemide (LASIX) 20 MG tablet Take 20 mg by mouth 2 (two) times daily.      . furosemide (LASIX) 40 MG tablet 1 in the morning and 1 in the afternoon about 4-5 PM  60 tablet  6  . isosorbide mononitrate (IMDUR) 30 MG 24 hr tablet Take 1 tablet (30 mg total) by mouth daily.  30 tablet  6  . potassium chloride SA (KLOR-CON M20) 20 MEQ tablet Take 1 tablet (20 mEq total) by mouth 2 (two) times daily.  30 tablet  6    BP 168/83  Pulse 78  Ht 4\' 10"  (1.473 m)  Wt 44.961 kg (99 lb 1.9 oz)  BMI 20.72 kg/m2 General: frail, NAD Neck: JVP 12 cm, no thyromegaly or thyroid nodule.  Lungs: Bilateral basilar crackles CV: Nondisplaced PMI.  Heart irregular S1/S2, no S3/S4, 3/6 HSM LLSB/apex.  No  peripheral edema.  No carotid bruit.  Normal pedal pulses.  Abdomen: Soft, nontender, no hepatosplenomegaly, no distention.  Skin: Venous stasis changes bilateral lower legs.   Neurologic: Alert and oriented x 3.  Psych: Normal affect. Extremities: No clubbing or cyanosis.  HEENT: Normal.

## 2011-04-20 ENCOUNTER — Telehealth: Payer: Self-pay | Admitting: Cardiology

## 2011-04-20 NOTE — Telephone Encounter (Signed)
[facility] on [address]was called, spoke to pharmacist who verified patient was given Isosorbide Mononitrate 30 mg daily.

## 2011-04-20 NOTE — Telephone Encounter (Signed)
New problem:  Patient  Was advise from pharmacy  She already taken medication- isosorbide mononitrate 30 mg. rx was not filled.

## 2011-04-20 NOTE — Telephone Encounter (Signed)
Isosorbide mononitrate lasts longer than isosorbide dinitrate unless this is some new formulation of isorsorbide dinitrate.  Ought to take isosorbide mononitrate.

## 2011-04-20 NOTE — Telephone Encounter (Signed)
Patient called stating when she saw Dr.McLean last he wanted her to be on long acting isosorbide.States when her son went to drug store pharmacist told him she was already taking long acting.States she wanted Dr.Mclean to know.

## 2011-04-25 ENCOUNTER — Ambulatory Visit (INDEPENDENT_AMBULATORY_CARE_PROVIDER_SITE_OTHER): Payer: Medicare Other | Admitting: *Deleted

## 2011-04-25 ENCOUNTER — Encounter (HOSPITAL_COMMUNITY): Payer: Self-pay | Admitting: *Deleted

## 2011-04-25 ENCOUNTER — Emergency Department (INDEPENDENT_AMBULATORY_CARE_PROVIDER_SITE_OTHER)
Admission: EM | Admit: 2011-04-25 | Discharge: 2011-04-25 | Disposition: A | Discharge status: Home / Self Care | Payer: Medicare Other | Source: Home / Self Care | Attending: Family Medicine | Admitting: Family Medicine

## 2011-04-25 DIAGNOSIS — I4891 Unspecified atrial fibrillation: Secondary | ICD-10-CM

## 2011-04-25 DIAGNOSIS — L03119 Cellulitis of unspecified part of limb: Secondary | ICD-10-CM

## 2011-04-25 DIAGNOSIS — L02419 Cutaneous abscess of limb, unspecified: Secondary | ICD-10-CM

## 2011-04-25 DIAGNOSIS — L988 Other specified disorders of the skin and subcutaneous tissue: Secondary | ICD-10-CM

## 2011-04-25 DIAGNOSIS — R238 Other skin changes: Secondary | ICD-10-CM

## 2011-04-25 MED ORDER — SILVER SULFADIAZINE 1 % EX CREA: TOPICAL_CREAM | Freq: Two times a day (BID) | CUTANEOUS | Status: DC

## 2011-04-25 MED ORDER — DOXYCYCLINE HYCLATE 100 MG PO TABS: 100.0000 mg | ORAL_TABLET | Freq: Two times a day (BID) | ORAL | Status: AC

## 2011-04-25 MED ORDER — SILVER SULFADIAZINE 1 % EX CREA: TOPICAL_CREAM | Freq: Once | CUTANEOUS | Status: DC

## 2011-04-25 MED ORDER — DOXYCYCLINE HYCLATE 100 MG PO TABS: 100.0000 mg | ORAL_TABLET | Freq: Two times a day (BID) | ORAL | Status: DC

## 2011-04-25 NOTE — ED Notes (Signed)
Pt with large blister above left knee anterior and posterior right knee x months per pt being treated at wound center last treatment  over one month ago - pt has relocated to [place] to be close to family - has seen cardiologist but does not have primary care physician

## 2011-04-25 NOTE — ED Provider Notes (Signed)
History     CSN: [id number]  Arrival date & time 04/25/11  1532   First MD Initiated Contact with Patient 04/25/11 1630      Chief Complaint  Patient presents with  . Blister    (Consider location/radiation/quality/duration/timing/severity/associated sxs/prior treatment) HPI Comments: Janice Petersen presents for evaluation for 2 large blisters. One blister is over her left middle thigh. This blister measures approximately 4 cm in diameter. Serous sanguinous fluid is visible in the blister. There is necrotic tissue at the border of the blister. The second blister is on her posterior right thigh just superior to the popliteal fossa. This blister is dark and black in color. It is firm to palpation. It is likely filled with coagulated blood. She reports a history of end-stage heart failure. She reports a long history of lower extremity edema. However, she reports that these blisters are new in character and appeared only in December. Her only new medication is isosorbide started last month by her cardiologist. She is on warfarin. She takes is for valvular disease. She was treated for these blisters by dermatologist in [facility] from her she moved. She states that the blister was unroofed and treated with Silvadene and sterile dressing. She is currently establishing care with her primary care physician. She is ready been evaluated by the cardiologist. There is no surrounding erythema or around both blisters.  Patient is a 78 y.o. female presenting with general illness. The history is provided by the patient and a relative.  Illness  The current episode started 3 to 5 days ago. The onset was sudden. The problem has been gradually worsening. The problem is moderate.    Past Medical History  Diagnosis Date  . Hypertension   . Polymyalgia   . Abdominal discomfort   . Aortic insufficiency   . Renal artery stenosis   . Atrial fibrillation   . CAD (coronary artery disease)   . Hyperlipidemia   .  Staphylococcal sepsis     Past Surgical History  Procedure Date  . Appendectomy 1942  . Other surgical history 1957    HYSTERECTOMY    Family History  Problem Relation Age of Onset  . Heart disease Mother   . Cancer Father     History  Substance Use Topics  . Smoking status: Former Games developer  . Smokeless tobacco: Not on file  . Alcohol Use: Yes    OB History    Grav Para Term Preterm Abortions TAB SAB Ect Mult Living                  Review of Systems  Constitutional: Negative.   HENT: Negative.   Eyes: Negative.   Respiratory: Negative.   Cardiovascular: Negative.   Gastrointestinal: Negative.   Genitourinary: Negative.   Musculoskeletal: Negative.   Skin: Positive for color change and wound.  Neurological: Negative.     Allergies  Dyazide; Procardia; Augmentin; Ciprofloxacin; Codeine; Norvasc; Other; Bactrim; Macrobid; Niacin and related; and Vancomycin  Home Medications   Current Outpatient Rx  Name Route Sig Dispense Refill  . ATENOLOL 50 MG PO TABS Oral Take 50 mg by mouth 2 (two) times daily.    Marland Kitchen CALCIUM 500 + D PO Oral Take by mouth daily.    Marland Kitchen CLONIDINE HCL 0.2 MG PO TABS Oral Take 0.2 mg by mouth 2 (two) times daily.    . COQ10 PO Oral Take 400 mg by mouth.    . FUROSEMIDE 40 MG PO TABS  1 in the morning  and 1 in the afternoon about 4-5 PM 60 tablet 6  . ISOSORBIDE MONONITRATE ER 30 MG PO TB24 Oral Take 1 tablet (30 mg total) by mouth daily. 30 tablet 6  . LEVOTHYROXINE SODIUM 100 MCG PO TABS Oral Take 100 mcg by mouth daily. 1/2 tab daily    . LISINOPRIL 20 MG PO TABS Oral Take 20 mg by mouth 2 (two) times daily.    Marland Kitchen LOVASTATIN 20 MG PO TABS Oral Take 20 mg by mouth at bedtime.    . CENTRUM SILVER PO Oral Take by mouth daily.    Marland Kitchen POTASSIUM CHLORIDE CRYS ER 20 MEQ PO TBCR Oral Take 1 tablet (20 mEq total) by mouth 2 (two) times daily. 30 tablet 6  . DOXYCYCLINE HYCLATE 100 MG PO TABS Oral Take 1 tablet (100 mg total) by mouth 2 (two) times daily.  20 tablet 0  . SILVER SULFADIAZINE 1 % EX CREA Topical Apply topically 2 (two) times daily. 50 g 0  . WARFARIN SODIUM 2 MG PO TABS Oral Take 2 mg by mouth as directed. 2mg  mon wed fri   1 mg sun/tues/thurs/sat   Total 10mg  a week      BP 157/88  Pulse 82  Temp(Src) 98.1 F (36.7 C) (Oral)  Resp 16  SpO2 95%  Physical Exam  Nursing note and vitals reviewed. Constitutional: She is oriented to person, place, and time. She appears well-developed and well-nourished.  HENT:  Head: Normocephalic and atraumatic.  Eyes: EOM are normal.  Neck: Normal range of motion.  Pulmonary/Chest: Effort normal.  Musculoskeletal: Normal range of motion.  Neurological: She is alert and oriented to person, place, and time.  Skin: Skin is warm and dry.     Psychiatric: Her behavior is normal.    ED Course  INCISION AND DRAINAGE Date/Time: 04/25/2011 5:30 PM Performed by: Richardo Priest Authorized by: Richardo Priest Consent: Verbal consent obtained. Risks and benefits: risks, benefits and alternatives were discussed Consent given by: patient Patient understanding: patient states understanding of the procedure being performed Patient identity confirmed: verbally with patient and arm band Type: bulla Body area: lower extremity Location details: left leg Incision type: removed tissue using scissors. Drainage: serosanguinous Drainage amount: moderate Wound treatment: wound left open Patient tolerance: Patient tolerated the procedure well with no immediate complications.   (including critical care time)  Labs Reviewed - No data to display No results found.   1. Bullae   2. Cellulitis and abscess of leg       MDM  Bullae unroofed, drained; serosanguinous fluid expressed from anterior lesion; coagulated blood expressed from posterior lesion; silvadene applied with dry sterile dressing; return in 48 hours for evaluation; areas marked with skin marker; TMP-SMX rx given        Richardo Priest, MD 04/25/11 2029

## 2011-04-25 NOTE — ED Notes (Signed)
Left leg with redness and swelling hot to touch

## 2011-04-25 NOTE — Discharge Instructions (Signed)
Take medications as directed. Keep wounds clean, dry, and covered. Return in 48 hours for re-evaluation. Follow up with your primary provider for continued long-term management of your symptoms.

## 2011-04-27 ENCOUNTER — Emergency Department (INDEPENDENT_AMBULATORY_CARE_PROVIDER_SITE_OTHER)
Admission: EM | Admit: 2011-04-27 | Discharge: 2011-04-27 | Disposition: A | Discharge status: Home / Self Care | Payer: Medicare Other | Source: Home / Self Care

## 2011-04-27 ENCOUNTER — Encounter (HOSPITAL_COMMUNITY): Payer: Self-pay | Admitting: Cardiology

## 2011-04-27 DIAGNOSIS — L0390 Cellulitis, unspecified: Secondary | ICD-10-CM

## 2011-04-27 DIAGNOSIS — R238 Other skin changes: Secondary | ICD-10-CM

## 2011-04-27 DIAGNOSIS — L0291 Cutaneous abscess, unspecified: Secondary | ICD-10-CM

## 2011-04-27 DIAGNOSIS — L988 Other specified disorders of the skin and subcutaneous tissue: Secondary | ICD-10-CM

## 2011-04-27 MED ORDER — SILVER SULFADIAZINE 1 % EX CREA: TOPICAL_CREAM | Freq: Two times a day (BID) | CUTANEOUS | Status: DC

## 2011-04-27 NOTE — ED Provider Notes (Signed)
History     CSN: [id number]  Arrival date & time 04/27/11  0817   None     No chief complaint on file.   (Consider location/radiation/quality/duration/timing/severity/associated sxs/prior treatment) HPI Comments: Patient returns today for recheck. She was seen 2 days ago with 2 bolus lesions on her thighs which were opened. She is changing her dressings twice a day and applying Silvadene cream. She is also taking doxycycline. She states that she is feeling well and that the redness surrounding the lesions appears to be improving.   Past Medical History  Diagnosis Date  . Hypertension   . Polymyalgia   . Abdominal discomfort   . Aortic insufficiency   . Renal artery stenosis   . Atrial fibrillation   . CAD (coronary artery disease)   . Hyperlipidemia   . Staphylococcal sepsis     Past Surgical History  Procedure Date  . Appendectomy 1942  . Other surgical history 1957    HYSTERECTOMY    Family History  Problem Relation Age of Onset  . Heart disease Mother   . Cancer Father     History  Substance Use Topics  . Smoking status: Former Games developer  . Smokeless tobacco: Not on file  . Alcohol Use: Yes    OB History    Grav Para Term Preterm Abortions TAB SAB Ect Mult Living                  Review of Systems  Constitutional: Negative for fever and chills.  Cardiovascular: Positive for leg swelling.  Skin: Positive for color change and wound.    Allergies  Dyazide; Procardia; Augmentin; Ciprofloxacin; Codeine; Norvasc; Other; Bactrim; Macrobid; Niacin and related; and Vancomycin  Home Medications   Current Outpatient Rx  Name Route Sig Dispense Refill  . ATENOLOL 50 MG PO TABS Oral Take 50 mg by mouth 2 (two) times daily.    Marland Kitchen CALCIUM 500 + D PO Oral Take by mouth daily.    Marland Kitchen CLONIDINE HCL 0.2 MG PO TABS Oral Take 0.2 mg by mouth 2 (two) times daily.    . COQ10 PO Oral Take 400 mg by mouth.    . DOXYCYCLINE HYCLATE 100 MG PO TABS Oral Take 1 tablet (100 mg  total) by mouth 2 (two) times daily. 20 tablet 0  . FUROSEMIDE 40 MG PO TABS  1 in the morning and 1 in the afternoon about 4-5 PM 60 tablet 6  . ISOSORBIDE MONONITRATE ER 30 MG PO TB24 Oral Take 1 tablet (30 mg total) by mouth daily. 30 tablet 6  . LEVOTHYROXINE SODIUM 100 MCG PO TABS Oral Take 100 mcg by mouth daily. 1/2 tab daily    . LISINOPRIL 20 MG PO TABS Oral Take 20 mg by mouth 2 (two) times daily.    Marland Kitchen LOVASTATIN 20 MG PO TABS Oral Take 20 mg by mouth at bedtime.    . CENTRUM SILVER PO Oral Take by mouth daily.    Marland Kitchen POTASSIUM CHLORIDE CRYS ER 20 MEQ PO TBCR Oral Take 1 tablet (20 mEq total) by mouth 2 (two) times daily. 30 tablet 6  . WARFARIN SODIUM 2 MG PO TABS Oral Take 2 mg by mouth as directed. 2mg  mon wed fri   1 mg sun/tues/thurs/sat   Total 10mg  a week    . SILVER SULFADIAZINE 1 % EX CREA Topical Apply topically 2 (two) times daily. 50 g 0    BP 145/71  Pulse 77  Temp(Src) 97.7 F (36.5  C) (Oral)  Resp 18  SpO2 97%  Physical Exam  Nursing note and vitals reviewed. Constitutional: She appears well-developed and well-nourished. No distress.  HENT:  Head: Normocephalic and atraumatic.  Cardiovascular: Normal rate, regular rhythm and normal heart sounds.   Pulmonary/Chest: Effort normal and breath sounds normal.  Neurological: She is alert.  Skin: Skin is warm and dry.       Posterior Rt distal thigh superior to popliteal fossa 3 cm open lesion covered with yellowish exudate, and mild surrounding erythema.  Medial Lt distal thigh 4 cm open lesion covered with yellowish exudate with surrounding erythema. Erythema has receded from markings 2 days ago.   Psychiatric: She has a normal mood and affect.    ED Course  Procedures (including critical care time)  Labs Reviewed - No data to display No results found.   1. Cellulitis   2. Bullae       MDM  Improving erythema from visit 2 days ago. Pt to continue current treatment plan. She has an appt with her new PCP  in 5 days. Will f/u with new PCP at that time.        Melody Comas, [place] 04/27/11 [phone]

## 2011-04-27 NOTE — ED Notes (Signed)
Wounds cleansed with chlorahexadine scrub pt tolerated procedure well. Silvadene applied then non-adhering dressing applied to wounds of left posterior thigh and right posterior knee. Pt verbalizes understanding of wound care at home.

## 2011-04-27 NOTE — Discharge Instructions (Signed)
  Continue to change the dressing twice a day and apply silvadene cream.  Gently wash the areas of the open lesions with a clean soft cloth and water when you change the dressings. You may also use a mild soap if tolerated. Continue your antibiotics as prescribed.  Keep your appt with your new primary care provider on Wednesday as planned. Return sooner if you have any change or worsening of symptoms.

## 2011-04-27 NOTE — ED Notes (Signed)
Recheck of wounds to left posterior thigh and right posterior knee. Denies fever. Area of redness has not increased since marked on 2/12 by Dr Juanetta Gosling.

## 2011-05-02 ENCOUNTER — Other Ambulatory Visit (INDEPENDENT_AMBULATORY_CARE_PROVIDER_SITE_OTHER): Payer: Medicare Other

## 2011-05-02 ENCOUNTER — Ambulatory Visit (INDEPENDENT_AMBULATORY_CARE_PROVIDER_SITE_OTHER): Payer: Medicare Other | Admitting: Family

## 2011-05-02 ENCOUNTER — Encounter: Payer: Self-pay | Admitting: Family

## 2011-05-02 DIAGNOSIS — J449 Chronic obstructive pulmonary disease, unspecified: Secondary | ICD-10-CM

## 2011-05-02 DIAGNOSIS — R609 Edema, unspecified: Secondary | ICD-10-CM

## 2011-05-02 DIAGNOSIS — I4891 Unspecified atrial fibrillation: Secondary | ICD-10-CM

## 2011-05-02 DIAGNOSIS — R0602 Shortness of breath: Secondary | ICD-10-CM

## 2011-05-02 DIAGNOSIS — I509 Heart failure, unspecified: Secondary | ICD-10-CM

## 2011-05-02 DIAGNOSIS — I10 Essential (primary) hypertension: Secondary | ICD-10-CM

## 2011-05-02 DIAGNOSIS — E039 Hypothyroidism, unspecified: Secondary | ICD-10-CM

## 2011-05-02 DIAGNOSIS — I5032 Chronic diastolic (congestive) heart failure: Secondary | ICD-10-CM

## 2011-05-02 DIAGNOSIS — E785 Hyperlipidemia, unspecified: Secondary | ICD-10-CM

## 2011-05-02 LAB — BASIC METABOLIC PANEL
BUN: 35 mg/dL — ABNORMAL HIGH (ref 6–23)
CO2: 28 mEq/L (ref 19–32)
Calcium: 9.3 mg/dL (ref 8.4–10.5)
Chloride: 102 mEq/L (ref 96–112)
Creatinine, Ser: 1.4 mg/dL — ABNORMAL HIGH (ref 0.4–1.2)
GFR: 38.48 mL/min — ABNORMAL LOW (ref >60.00)
Glucose, Bld: 80 mg/dL (ref 70–99)

## 2011-05-02 LAB — LIPID PANEL: HDL: 43.8 mg/dL (ref >39.00)

## 2011-05-02 NOTE — Progress Notes (Signed)
  Subjective:    Patient ID: Janice Petersen, female    DOB: 04-Feb-1916, 78 y.o.   MRN: [id number]  HPI 78 year old white female, nonsmoker, new patient to the practice and to establish care. She has a past medical history of COPD, hypertension, hyperlipidemia, hypothyroidism and congestive heart failure. She is currently stable on all of her medications and tolerating them well. And her last colonoscopy and mammogram are unknown, but patient declines testing.  Patient reports 2 ulcerations on her legs. 1 ulceration on the back of her right leg and a second in the front of her left leg that she's been seen in urgent care clinic 4. She is currently applying Silvadene to the lesion and appears to be healing well. She's taken doxycycline 100 mg twice daily for 10 days and reports improvement. Denies any numbness or tingling but continues to have bilateral pitting edema. She recently started cardiology who has increased her Lasix to 40 mg a day.   Review of Systems  Constitutional: Negative.   HENT: Negative.   Respiratory: Positive for shortness of breath. Negative for wheezing.   Cardiovascular: Positive for leg swelling. Negative for chest pain and palpitations.  Genitourinary: Negative.   Musculoskeletal: Negative.   Skin:       2 ulcerations noted. 1 to the right and left legs.   Neurological: Negative.   Hematological: Negative.   Psychiatric/Behavioral: Negative.        Objective:   Physical Exam  Constitutional: She is oriented to person, place, and time. She appears well-nourished.  HENT:  Left Ear: External ear normal.  Nose: Nose normal.  Mouth/Throat: Oropharynx is clear and moist.  Eyes: Conjunctivae are normal. Pupils are equal, round, and reactive to light.  Neck: Normal range of motion. Neck supple.  Cardiovascular: Normal rate, regular rhythm and normal heart sounds.   Pulmonary/Chest: Effort normal and breath sounds normal.  Musculoskeletal: Normal range of motion. She  exhibits edema.       Bilateral 2+ pitting edema  Neurological: She is alert and oriented to person, place, and time.  Skin: Skin is warm and dry.     Psychiatric: She has a normal mood and affect.          Assessment & Plan:  Assessment: COPD, Atrial Fibrillation, Hypertension, Bilateral leg ulcers, Bilateral pitting edema, Hypothyroidism, Hyperlipidemia   Plan: Patient will continue using Silvadene as directed by urgent care clinic. Complete doxycycline antibiotic regimen. Followup with cardiology as they manage her chronic conditions, and follow up here in 6 months. Patient has declined mammogram and colonoscopy screenings. We will obtain labs from her previous PCP [facility] to determine what labs are due. Continue the Coumadin clinic. Encouraged patient to call if her ulcerations do not heal properly, they appear to be healing well at this point. Discussed signs and symptoms of infection. Keep legs elevated.

## 2011-05-04 ENCOUNTER — Telehealth: Payer: Self-pay | Admitting: Family

## 2011-05-04 NOTE — Telephone Encounter (Signed)
Pt saw NP on 05-02-2011. Pt only has 3 abx pills left doxycycline-hyclate 100mg (originally prescribed by Dr Juanetta Gosling) pt would like to know whether she needs refill if so please call [facility]

## 2011-05-04 NOTE — Telephone Encounter (Signed)
Pt aware and verbalized understanding.  

## 2011-05-04 NOTE — Telephone Encounter (Signed)
Finish antibiotic and call if she has s/s of infection as discussed at her OV.

## 2011-05-08 NOTE — ED Provider Notes (Signed)
Medical screening examination/treatment/procedure(s) were performed by non-physician practitioner and as supervising physician I was immediately available for consultation/collaboration.   Asah Lamay DOUGLAS MD.    Cyndy Braver Douglas Aylanie Cubillos, MD 05/08/11 1520 

## 2011-05-10 ENCOUNTER — Ambulatory Visit (INDEPENDENT_AMBULATORY_CARE_PROVIDER_SITE_OTHER): Payer: Medicare Other

## 2011-05-10 ENCOUNTER — Ambulatory Visit (INDEPENDENT_AMBULATORY_CARE_PROVIDER_SITE_OTHER): Payer: Medicare Other | Admitting: Cardiology

## 2011-05-10 ENCOUNTER — Encounter: Payer: Self-pay | Admitting: Cardiology

## 2011-05-10 DIAGNOSIS — I272 Pulmonary hypertension, unspecified: Secondary | ICD-10-CM

## 2011-05-10 DIAGNOSIS — I5022 Chronic systolic (congestive) heart failure: Secondary | ICD-10-CM

## 2011-05-10 DIAGNOSIS — R238 Other skin changes: Secondary | ICD-10-CM

## 2011-05-10 DIAGNOSIS — L97909 Non-pressure chronic ulcer of unspecified part of unspecified lower leg with unspecified severity: Secondary | ICD-10-CM

## 2011-05-10 DIAGNOSIS — L97919 Non-pressure chronic ulcer of unspecified part of right lower leg with unspecified severity: Secondary | ICD-10-CM

## 2011-05-10 DIAGNOSIS — I509 Heart failure, unspecified: Secondary | ICD-10-CM

## 2011-05-10 DIAGNOSIS — I4891 Unspecified atrial fibrillation: Secondary | ICD-10-CM

## 2011-05-10 DIAGNOSIS — E785 Hyperlipidemia, unspecified: Secondary | ICD-10-CM

## 2011-05-10 DIAGNOSIS — I2510 Atherosclerotic heart disease of native coronary artery without angina pectoris: Secondary | ICD-10-CM

## 2011-05-10 DIAGNOSIS — I10 Essential (primary) hypertension: Secondary | ICD-10-CM

## 2011-05-10 DIAGNOSIS — L988 Other specified disorders of the skin and subcutaneous tissue: Secondary | ICD-10-CM

## 2011-05-10 DIAGNOSIS — L97929 Non-pressure chronic ulcer of unspecified part of left lower leg with unspecified severity: Secondary | ICD-10-CM

## 2011-05-10 DIAGNOSIS — I5032 Chronic diastolic (congestive) heart failure: Secondary | ICD-10-CM

## 2011-05-10 DIAGNOSIS — I2789 Other specified pulmonary heart diseases: Secondary | ICD-10-CM

## 2011-05-10 LAB — BASIC METABOLIC PANEL
BUN: 29 mg/dL — ABNORMAL HIGH (ref 6–23)
GFR: 43.21 mL/min — ABNORMAL LOW (ref >60.00)

## 2011-05-10 LAB — POCT INR: INR: 3.3

## 2011-05-10 MED ORDER — CARVEDILOL 12.5 MG PO TABS: 12.5000 mg | ORAL_TABLET | Freq: Two times a day (BID) | ORAL | Status: DC

## 2011-05-10 NOTE — Assessment & Plan Note (Signed)
Weight is down 7 lbs.  Still NYHA class III symptoms.  She is still volume overloaded.  I am going to keep her on the current dose of Lasix if BMET today shows a stable creatinine.

## 2011-05-10 NOTE — Assessment & Plan Note (Signed)
Chronic atrial fibrillation.  Good rate control.  She is mildly bradycardic today and has CKD.  I would prefer her to be on a different beta blocker that is less renally-dependent.  I am going to stop atenolol and start Coreg 12.5 mg bid.  Continue warfarin.

## 2011-05-10 NOTE — Progress Notes (Signed)
PCP: Orvan Falconer  78 yo with history of diastolic CHF, valvular heart disease, CAD, and chronic atrial fibrillation presents for cardiology evaluation.  She used to live in [facility] and was followed by a cardiologist down there.  She has recently moved to [facility] to be nearer her family (currently living with one of her sons).  She had a stent placed in her RCA in 2004.  She has had a long history of valvular disease, last echo showed moderate to severe MR and moderate to severe TR with severe pulmonary hypertension.  She has a history of diastolic CHF.  She has been in atrial fibrillation since 2003 on warfarin.  About 2 months ago, Janice Petersen developed worsening exertional dyspnea.  She was seen by her PCP in [facility] and started on home oxygen.  She had been taking Lasix 40 mg daily.  She also developed worsening lower extremity swelling with venous stasis changes.  Prior to this, she was actually walking 1/2 mile/day.  Since starting oxygen, she has felt better.  When I initially saw her, she looked quite volume overloaded.  I increased her Lasix to 40 mg po bid.  Since then, she has lost 7 lbs.  Edema is much improved.  She seems to be breathing a bit better though she is still not very active.  She is now walking through her house without dyspnea.  No chest pain.  BP is controlled.  She continues to have lower leg ulcerations.  She is dressing them with Silvadene.    Labs (2/13): K 4.1, creatinine 1.1 => 1.4, proBNP 683, LDL 54, HDL 44  PMH: 1. Diastolic CHF: Echo (10/12) with EF 65%, mild AI, moderate to severe MR, moderate to severe TR, PA systolic pressure 84 mmHg.  She has been on home oxygen since 1/13.  2. Chronic atrial fibrillation since 2003. 3. PMR 4. H/o appendectomy 5. TAH 6. Renal artery stenosis: 50% left renal artery stenosis found on angiogram in 8/02.  7. Hypothyroidism 8. MRSA sepsis in 2006 9. Hyperlipidemia 10. H/o PE 11. CAD: Cypher DES to proximal RCA in 2/04.   Last Lexiscan myoview in 2011 showed EF 67%, normal.  12. Valvular heart disease: Last echo in 10/12 showed moderate to severe MR, moderate to severe TR.  13. Pulmonary hypertension: Suspect secondary to elevated left atrial pressure as well as probable reactive pulmonary vascular changes.   SH: Widow.  Recently moved from [facility] to [place] and living with her son.  Has 3 sons.  Former smoker (quit years ago).  Rare ETOH.   FH: Mother with CHF at 14, father with multiple myeloma.   ROS: All systems reviewed and negative except as per HPI.   Current Outpatient Prescriptions  Medication Sig Dispense Refill  . Calcium Carbonate-Vitamin D (CALCIUM 500 + D PO) Take by mouth daily.      . cloNIDine (CATAPRES) 0.2 MG tablet Take 0.2 mg by mouth 2 (two) times daily.      . Coenzyme Q10 (COQ10 PO) Take 400 mg by mouth.      . furosemide (LASIX) 40 MG tablet 1 in the morning and 1 in the afternoon about 4-5 PM  60 tablet  6  . isosorbide mononitrate (IMDUR) 30 MG 24 hr tablet Take 1 tablet (30 mg total) by mouth daily.  30 tablet  6  . levothyroxine (SYNTHROID, LEVOTHROID) 100 MCG tablet Take 100 mcg by mouth daily. 1/2 tab daily      . lisinopril (PRINIVIL,ZESTRIL)  20 MG tablet Take 20 mg by mouth 2 (two) times daily.      Marland Kitchen lovastatin (MEVACOR) 20 MG tablet Take 20 mg by mouth at bedtime.      . Multiple Vitamins-Minerals (CENTRUM SILVER PO) Take by mouth daily.      . potassium chloride SA (KLOR-CON M20) 20 MEQ tablet Take 1 tablet (20 mEq total) by mouth 2 (two) times daily.  30 tablet  6  . silver sulfADIAZINE (SILVADENE) 1 % cream Apply topically 2 (two) times daily.  50 g  0  . warfarin (COUMADIN) 2 MG tablet Take 2 mg by mouth as directed. 2mg  mon wed fri   1 mg sun/tues/thurs/sat   Total 10mg  a week      . carvedilol (COREG) 12.5 MG tablet Take 1 tablet (12.5 mg total) by mouth 2 (two) times daily.  60 tablet  6    BP 128/74  Pulse 52  Ht 4\' 10"  (1.473 m)  Wt 92 lb (41.731 kg)   BMI 19.23 kg/m2 General: frail, NAD Neck: JVP 10 cm, no thyromegaly or thyroid nodule.  Lungs: Bilateral basilar crackles CV: Nondisplaced PMI.  Heart irregular S1/S2, no S3/S4, 3/6 HSM LLSB/apex.  1+ edema 1/2 to knees bilaterally.  No carotid bruit.  Normal pedal pulses.  Abdomen: Soft, nontender, no hepatosplenomegaly, no distention.  Skin: Venous stasis changes bilateral lower legs.   Neurologic: Alert and oriented x 3.  Psych: Normal affect. Extremities: No clubbing or cyanosis.

## 2011-05-10 NOTE — Assessment & Plan Note (Signed)
LDL at goal (< 70) when recently checked.  

## 2011-05-10 NOTE — Assessment & Plan Note (Signed)
Stable with no chest pain.  Continue warfarin (no indication to add ASA), statin, beta blocker, ACEI.  

## 2011-05-10 NOTE — Patient Instructions (Signed)
Stop atenolol.  Start coreg(carvedilol) 12.5mg  twice a day.  Your physician recommends that you have  lab work today--BMET 428.32  Dr Shirlee Latch will refer you to the [facility] for management and treatment of the bullae/lesions on your right leg.  Your physician recommends that you schedule a follow-up appointment in: 6 weeks with Dr Shirlee Latch.

## 2011-05-10 NOTE — Assessment & Plan Note (Signed)
I will obtain a wound care consult given the lower leg venous stasis changes with bullae.

## 2011-05-10 NOTE — Assessment & Plan Note (Signed)
Probably secondary to diastolic LV failure with elevated LA pressure and reactive pulmonary artery changes.  

## 2011-05-10 NOTE — Assessment & Plan Note (Signed)
BP is under better control.

## 2011-05-17 ENCOUNTER — Encounter (HOSPITAL_BASED_OUTPATIENT_CLINIC_OR_DEPARTMENT_OTHER): Payer: Medicare Other | Attending: Internal Medicine

## 2011-05-17 DIAGNOSIS — E039 Hypothyroidism, unspecified: Secondary | ICD-10-CM | POA: Insufficient documentation

## 2011-05-17 DIAGNOSIS — I079 Rheumatic tricuspid valve disease, unspecified: Secondary | ICD-10-CM | POA: Insufficient documentation

## 2011-05-17 DIAGNOSIS — I872 Venous insufficiency (chronic) (peripheral): Secondary | ICD-10-CM | POA: Insufficient documentation

## 2011-05-17 DIAGNOSIS — Z7901 Long term (current) use of anticoagulants: Secondary | ICD-10-CM | POA: Insufficient documentation

## 2011-05-17 DIAGNOSIS — L989 Disorder of the skin and subcutaneous tissue, unspecified: Secondary | ICD-10-CM | POA: Insufficient documentation

## 2011-05-17 DIAGNOSIS — Z86711 Personal history of pulmonary embolism: Secondary | ICD-10-CM | POA: Insufficient documentation

## 2011-05-17 DIAGNOSIS — I4891 Unspecified atrial fibrillation: Secondary | ICD-10-CM | POA: Insufficient documentation

## 2011-05-17 DIAGNOSIS — I2789 Other specified pulmonary heart diseases: Secondary | ICD-10-CM | POA: Insufficient documentation

## 2011-05-17 DIAGNOSIS — L97809 Non-pressure chronic ulcer of other part of unspecified lower leg with unspecified severity: Secondary | ICD-10-CM | POA: Insufficient documentation

## 2011-05-17 DIAGNOSIS — I059 Rheumatic mitral valve disease, unspecified: Secondary | ICD-10-CM | POA: Insufficient documentation

## 2011-05-17 DIAGNOSIS — M353 Polymyalgia rheumatica: Secondary | ICD-10-CM | POA: Insufficient documentation

## 2011-05-17 DIAGNOSIS — I2510 Atherosclerotic heart disease of native coronary artery without angina pectoris: Secondary | ICD-10-CM | POA: Insufficient documentation

## 2011-05-17 DIAGNOSIS — L97109 Non-pressure chronic ulcer of unspecified thigh with unspecified severity: Secondary | ICD-10-CM | POA: Insufficient documentation

## 2011-05-17 NOTE — Progress Notes (Signed)
[facility]  [place], [facility]                   ACCOUNT NO.:  [id number]  MEDICAL RECORD NO.:  [id number]      DATE OF BIRTH:  January 31, 1916  PHYSICIAN:  [name], M.D. VISIT DATE:  05/17/2011                                  OFFICE VISIT   [name] arrives as a new patient for review of wounds on Janice Petersen bilateral legs.  Janice Petersen tells Janice Petersen that Janice Petersen has no prior history of wounds; however, in January of this year, developed bullae on Janice Petersen left leg as well as Janice Petersen right leg.  Janice Petersen was initially followed for a month at a Wound Care Center in [place] where Janice Petersen lived.  Subsequently, after relocating to this area to be closer to Janice Petersen sons, was also seen by Dermatology at [facility].  The bullae at that point were opened and apparently cultured. Janice Petersen was on an antibiotic for a period of time, Keflex, although I do not have any culture results.  The patient does not seem certain about whether an actual biopsy was done.  Janice Petersen has no prior history of a bullous skin disorder.  PAST MEDICAL HISTORY:  Actually extensive and includes longstanding valvular heart disease, which includes mitral regurgitation, tricuspid regurgitation, and significant pulmonary hypertension.  Janice Petersen has a preserved ejection fraction, however, echo from October 2012 shows: 1. PA systolic pressures of 84. 2. Chronic atrial fibrillation. 3. Polymyalgia rheumatica. 4. Renal artery stenosis. 5. Hypothyroidism. 6. MRSA sepsis in 2006. 7. History of PE. 8. Coronary artery disease.  MEDICATION LIST:  Reviewed.  Janice Petersen is not on prednisone; however, Janice Petersen is on Coumadin.  SOCIAL HISTORY:  Janice Petersen is a widow, has 3 sons in this area, actually lives with one of Janice Petersen sons.  Janice Petersen moved from [facility] to [place] to be closer to Janice Petersen family.  Janice Petersen is former smoker, quit years ago.  FAMILY HISTORY:  Mother with congestive heart failure at age 76.  Father had multiple myeloma.  REVIEW OF SYSTEMS:  RESPIRATORY:  Does  not complain of shortness of breath, although Janice Petersen is on oxygen.  CARDIAC:  No exertional chest pain. EXTREMITIES:  Janice Petersen does not describe claudication.  PHYSICAL EXAMINATION:  VITAL SIGNS:  Temperature is 97.4, pulse 70, respirations 18, blood pressure 139/55. RESPIRATORY:  Shallow, but otherwise clear air entry.  Janice Petersen does not have any wheezing.  Janice Petersen has peripheral cyanosis.  This is in spite of Janice Petersen oxygen. CARDIAC:  Janice Petersen has 3/6 pansystolic murmurs at the lower left sternal border and PMI.  Janice Petersen jugular venous pressure is elevated.  There are no gallops. EXTREMITIES:  Peripheral pulses are easily palpable in Janice Petersen feet.  Janice Petersen has severe venous stasis bilaterally, which even extends above Janice Petersen knee.  The wound exams are as follows.  Janice Petersen has 3 wounds, 1 on the left upper medial thigh measuring 2.7 x 2.3 x 0.11 on the right lateral knee posteriorly measuring 2 x 3 x 0.1, and 1 on the right anterior leg measuring 1.6 x 1 x 0.1.  All of these underwent excisional surface debridement.  Janice Petersen tolerated this well.  The wounds tie up quite nicely.  IMPRESSION:  Mostly venous stasis ulcerations, although the area above Janice Petersen knee is a bit odd for a venous stasis type  injury.  Nevertheless, all of these were dressed with Santyl, Hydrogel, Vaseline gauze, and bilateral Profore Lite.  Janice Petersen will leave them on for a week.  No cultures of these wounds were done, none of them appeared to be infected.  We will try solicit information from Janice Petersen wound care review in January 2013 from [facility], and also the Dermatology consult from [facility], which I believe was done recently.  All of this discussed with the patient and Janice Petersen 2 sons were present.          ______________________________ [name], M.D.     MGR/MEDQ  D:  05/17/2011  T:  05/17/2011  Job:  [id number]

## 2011-05-24 ENCOUNTER — Ambulatory Visit (INDEPENDENT_AMBULATORY_CARE_PROVIDER_SITE_OTHER): Payer: Medicare Other | Admitting: Pharmacist

## 2011-05-24 DIAGNOSIS — I4891 Unspecified atrial fibrillation: Secondary | ICD-10-CM

## 2011-05-24 LAB — POCT INR: INR: 3.4

## 2011-06-05 ENCOUNTER — Telehealth: Payer: Self-pay | Admitting: Cardiology

## 2011-06-05 NOTE — Telephone Encounter (Signed)
Pt is concerned because she seems to have a lack of moisture in her skin.  She is also voiding "constantly".  She states this problem started when she was switched from Atenolol to Carvedilol.  No increased sob and no lower extremity edema per pt.

## 2011-06-05 NOTE — Telephone Encounter (Signed)
Pt was notified.  

## 2011-06-05 NOTE — Telephone Encounter (Signed)
New Problem:    Patient called in today because she would like to reduce the dose of her furosemide (LASIX) 40 MG tablet (patient claimed that her medication was changed and that she is no longer taking furosemide, but the medication was similar to her Lasix). Please call back.

## 2011-06-05 NOTE — Telephone Encounter (Signed)
She should be on Lasix 40 mg bid.  Coreg would not be making her urinate more.  Would continue meds.  Lasix may be making her skin feel dry.

## 2011-06-07 ENCOUNTER — Ambulatory Visit (INDEPENDENT_AMBULATORY_CARE_PROVIDER_SITE_OTHER): Payer: Medicare Other

## 2011-06-07 DIAGNOSIS — I4891 Unspecified atrial fibrillation: Secondary | ICD-10-CM

## 2011-06-07 LAB — POCT INR: INR: 2.2

## 2011-06-12 ENCOUNTER — Telehealth: Payer: Self-pay | Admitting: Cardiology

## 2011-06-12 NOTE — Telephone Encounter (Signed)
Please return call to Patient Son Margrett Rud [phone]  Patient son calling to find out what OTC arthritis medication patient can take.  Please return call to [place] at [phone].

## 2011-06-12 NOTE — Telephone Encounter (Signed)
Spoke to patient she stated she has been having arthritis pain in her neck.Advised she can try tylenol arthritis as needed.Patient wanted to make sure this is ok with Dr.McLean.

## 2011-06-13 NOTE — Telephone Encounter (Signed)
Spoke with pt's son ,Trey Paula. He is aware tylenol is OK.

## 2011-06-13 NOTE — Telephone Encounter (Signed)
Tylenol is ok

## 2011-06-14 ENCOUNTER — Encounter (HOSPITAL_BASED_OUTPATIENT_CLINIC_OR_DEPARTMENT_OTHER): Payer: Medicare Other

## 2011-06-18 ENCOUNTER — Telehealth: Payer: Self-pay | Admitting: Cardiology

## 2011-06-18 NOTE — Telephone Encounter (Signed)
Pt having trouble with SOB, has oxygen, nose stuffed up, needs order for a mask, fax [phone] att: nick

## 2011-06-18 NOTE — Telephone Encounter (Signed)
Patient's daughter n law Olegario Messier called,stating patient has a stuffy nose and to breathe with nasal cannula,wanting a O2 mask.States uses [employer] # [phone].Spoke to [place] at  [facility] he did not advise using a mask.States will do more harm than good.He advised saline nasal spray and plain claritin as needed.Fowarded to [employer].

## 2011-06-19 ENCOUNTER — Ambulatory Visit: Payer: Medicare Other | Admitting: Cardiology

## 2011-06-22 ENCOUNTER — Ambulatory Visit: Payer: Medicare Other | Admitting: Cardiology

## 2011-06-26 ENCOUNTER — Ambulatory Visit: Payer: Medicare Other | Admitting: Cardiology

## 2011-06-28 ENCOUNTER — Ambulatory Visit (INDEPENDENT_AMBULATORY_CARE_PROVIDER_SITE_OTHER): Payer: Medicare Other | Admitting: Pharmacist

## 2011-06-28 DIAGNOSIS — I4891 Unspecified atrial fibrillation: Secondary | ICD-10-CM

## 2011-07-10 ENCOUNTER — Encounter: Payer: Self-pay | Admitting: Cardiology

## 2011-07-10 ENCOUNTER — Ambulatory Visit (INDEPENDENT_AMBULATORY_CARE_PROVIDER_SITE_OTHER): Payer: Medicare Other | Admitting: *Deleted

## 2011-07-10 ENCOUNTER — Ambulatory Visit (INDEPENDENT_AMBULATORY_CARE_PROVIDER_SITE_OTHER): Payer: Medicare Other | Admitting: Cardiology

## 2011-07-10 VITALS — BP 145/82 | HR 69 | Ht <= 58 in | Wt 97.0 lb

## 2011-07-10 DIAGNOSIS — L97919 Non-pressure chronic ulcer of unspecified part of right lower leg with unspecified severity: Secondary | ICD-10-CM

## 2011-07-10 DIAGNOSIS — E785 Hyperlipidemia, unspecified: Secondary | ICD-10-CM

## 2011-07-10 DIAGNOSIS — R0602 Shortness of breath: Secondary | ICD-10-CM

## 2011-07-10 DIAGNOSIS — I4891 Unspecified atrial fibrillation: Secondary | ICD-10-CM

## 2011-07-10 DIAGNOSIS — I5032 Chronic diastolic (congestive) heart failure: Secondary | ICD-10-CM

## 2011-07-10 DIAGNOSIS — L97909 Non-pressure chronic ulcer of unspecified part of unspecified lower leg with unspecified severity: Secondary | ICD-10-CM

## 2011-07-10 DIAGNOSIS — I2510 Atherosclerotic heart disease of native coronary artery without angina pectoris: Secondary | ICD-10-CM

## 2011-07-10 DIAGNOSIS — I2789 Other specified pulmonary heart diseases: Secondary | ICD-10-CM

## 2011-07-10 DIAGNOSIS — I509 Heart failure, unspecified: Secondary | ICD-10-CM

## 2011-07-10 DIAGNOSIS — I272 Pulmonary hypertension, unspecified: Secondary | ICD-10-CM

## 2011-07-10 MED ORDER — CLONIDINE HCL 0.2 MG PO TABS: 0.2000 mg | ORAL_TABLET | Freq: Two times a day (BID) | ORAL | Status: DC

## 2011-07-10 NOTE — Patient Instructions (Addendum)
Try Magnesium Oxide  200mg  daily  in the evening to see if it will help your legs at night.  Your physician recommends that you have  lab work today--BMET/BNP 428.32  Your physician recommends that you schedule a follow-up appointment in: 3 months with Dr Shirlee Latch.

## 2011-07-11 ENCOUNTER — Other Ambulatory Visit: Payer: Self-pay | Admitting: *Deleted

## 2011-07-11 LAB — BASIC METABOLIC PANEL
BUN: 47 mg/dL — ABNORMAL HIGH (ref 6–23)
Chloride: 107 mEq/L (ref 96–112)
GFR: 32.83 mL/min — ABNORMAL LOW (ref >60.00)
Glucose, Bld: 108 mg/dL — ABNORMAL HIGH (ref 70–99)
Potassium: 4.9 mEq/L (ref 3.5–5.1)
Sodium: 144 mEq/L (ref 135–145)

## 2011-07-11 LAB — BRAIN NATRIURETIC PEPTIDE: Pro B Natriuretic peptide (BNP): 481 pg/mL — ABNORMAL HIGH (ref 0.0–100.0)

## 2011-07-11 NOTE — Assessment & Plan Note (Signed)
Chronic atrial fibrillation.  Good rate control.  Continue Coreg and warfarin.

## 2011-07-11 NOTE — Assessment & Plan Note (Signed)
Venous stasis ulcers.  She has been through wound care and is now doing well.

## 2011-07-11 NOTE — Assessment & Plan Note (Signed)
Volume status is better but she still appears to have some volume overload.  She has, however, had a significant rise in her creatinine.  I will cut her Lasix back to 40 mg daily and decrease KCl to 20 mEq daily. She will continue to wear compression stockings and watch salt intake.  Repeat BMET in 1 week.

## 2011-07-11 NOTE — Assessment & Plan Note (Signed)
LDL at goal (< 70) when recently checked.  

## 2011-07-11 NOTE — Progress Notes (Signed)
PCP: Orvan Falconer  78 yo with history of diastolic CHF, valvular heart disease, CAD, and chronic atrial fibrillation presents for cardiology evaluation.  She used to live in [facility] and was followed by a cardiologist down there.  She has recently moved to [facility] to be nearer her family (currently living with one of her sons).  She had a stent placed in her RCA in 2004.  She has had a long history of valvular disease, last echo showed moderate to severe MR and moderate to severe TR with severe pulmonary hypertension.  She has a history of diastolic CHF.  She has been in atrial fibrillation since 2003 on warfarin.  Several months ago, Mrs Stolarz developed worsening exertional dyspnea.  She was seen by her PCP in [facility] and started on home oxygen.  She also developed worsening lower extremity swelling with venous stasis changes.  Prior to this, she was actually walking 1/2 mile/day.  When I initially saw her, she looked quite volume overloaded.  I increased her Lasix to 40 mg po bid.  Since then, her leg edema has mostly resolved.  She uses compression stockings.  Her venous stasis ulcers have healed and she was dismissed from the wound care clinic.  No dyspnea walking around the house.  She does not like the higher dose of Lasix because of incontinence.    Labs (2/13): K 4.1, creatinine 1.1 => 1.4, proBNP 683, LDL 54, HDL 44 Labs (4/13): Creatinine 1.87, K 4.9, BNP 486  PMH: 1. Diastolic CHF: Echo (10/12) with EF 65%, mild AI, moderate to severe MR, moderate to severe TR, PA systolic pressure 84 mmHg.  She has been on home oxygen since 1/13.  2. Chronic atrial fibrillation since 2003. 3. PMR 4. H/o appendectomy 5. TAH 6. Renal artery stenosis: 50% left renal artery stenosis found on angiogram in 8/02.  7. Hypothyroidism 8. MRSA sepsis in 2006 9. Hyperlipidemia 10. H/o PE 11. CAD: Cypher DES to proximal RCA in 2/04.  Last Lexiscan myoview in 2011 showed EF 67%, normal.  12. Valvular heart  disease: Last echo in 10/12 showed moderate to severe MR, moderate to severe TR.  13. Pulmonary hypertension: Suspect secondary to elevated left atrial pressure as well as probable reactive pulmonary vascular changes.  14. CKD  SH: Widow.  Recently moved from [facility] to [place] and living with her son.  Has 3 sons.  Former smoker (quit years ago).  Rare ETOH.   FH: Mother with CHF at 14, father with multiple myeloma.   ROS: All systems reviewed and negative except as per HPI.   Current Outpatient Prescriptions  Medication Sig Dispense Refill  . Calcium Carbonate-Vitamin D (CALCIUM 500 + D PO) Take by mouth daily.      . carvedilol (COREG) 12.5 MG tablet Take 1 tablet (12.5 mg total) by mouth 2 (two) times daily.  60 tablet  6  . cloNIDine (CATAPRES) 0.2 MG tablet Take 1 tablet (0.2 mg total) by mouth 2 (two) times daily.  60 tablet  6  . Coenzyme Q10 (COQ10 PO) Take 400 mg by mouth.      . isosorbide mononitrate (IMDUR) 30 MG 24 hr tablet Take 1 tablet (30 mg total) by mouth daily.  30 tablet  6  . levothyroxine (SYNTHROID, LEVOTHROID) 100 MCG tablet Take 100 mcg by mouth daily. 1/2 tab daily      . lisinopril (PRINIVIL,ZESTRIL) 20 MG tablet Take 20 mg by mouth 2 (two) times daily.      Marland Kitchen  lovastatin (MEVACOR) 20 MG tablet Take 20 mg by mouth at bedtime.      . Multiple Vitamins-Minerals (CENTRUM SILVER PO) Take by mouth daily.      Marland Kitchen warfarin (COUMADIN) 2 MG tablet Take 2 mg by mouth as directed. 2mg  mon wed fri   1 mg sun/tues/thurs/sat   Total 10mg  a week      . DISCONTD: furosemide (LASIX) 40 MG tablet 1 in the morning and 1 in the afternoon about 4-5 PM  60 tablet  6  . DISCONTD: potassium chloride SA (KLOR-CON M20) 20 MEQ tablet Take 1 tablet (20 mEq total) by mouth 2 (two) times daily.  30 tablet  6  . furosemide (LASIX) 40 MG tablet Take 1 tablet (40 mg total) by mouth daily.      . Magnesium Oxide 200 MG TABS Take one tablet daily    0  . potassium chloride SA (KLOR-CON M20)  20 MEQ tablet Take 1 tablet (20 mEq total) by mouth daily.        BP 145/82  Pulse 69  Ht 4\' 10"  (1.473 m)  Wt 97 lb (43.999 kg)  BMI 20.27 kg/m2 General: frail, NAD Neck: JVP 8 cm, no thyromegaly or thyroid nodule.  Lungs: Bilateral basilar crackles CV: Nondisplaced PMI.  Heart irregular S1/S2, no S3/S4, 3/6 HSM LLSB/apex.  1+ankle edema (improved).  No carotid bruit.  Normal pedal pulses.  Abdomen: Soft, nontender, no hepatosplenomegaly, no distention.  Skin: Unable to assess ulcerations, she is wearing compression stockings.    Neurologic: Alert and oriented x 3.  Psych: Normal affect. Extremities: No clubbing or cyanosis.

## 2011-07-11 NOTE — Assessment & Plan Note (Signed)
Stable with no chest pain.  Continue warfarin (no indication to add ASA), statin, beta blocker, ACEI.  

## 2011-07-11 NOTE — Assessment & Plan Note (Signed)
Probably secondary to diastolic LV failure with elevated LA pressure and reactive pulmonary artery changes.  

## 2011-07-16 NOTE — Progress Notes (Signed)
Addended by: Jacqlyn Krauss on: 07/16/2011 07:47 AM   Modules accepted: Orders

## 2011-07-20 ENCOUNTER — Telehealth: Payer: Self-pay | Admitting: Cardiology

## 2011-07-20 DIAGNOSIS — Z79899 Other long term (current) drug therapy: Secondary | ICD-10-CM

## 2011-07-20 NOTE — Telephone Encounter (Signed)
SPOKE WITH PT  C/O SOB  SINCE DR MCLEAN DECREASED LASIX ON 07-10-11  PT  DOESN'T FEEL THERE IS ANY EDEMA   WOULD LIKE TO TURN  UP O2 TO 3 LITERS  PT AWARE WILL DISCUSS WITH DR [facility].  PER DR MCLEAN  MAY INCREASE O2 TO  3 LITERS AND  MAY TAKE AN EXTRA  20 MG OF LASIX IN AFTERNOON  NEEDS REPEAT BMET IN  1 WEEK. PT AWARE OF ABOVE .Zack Seal

## 2011-07-20 NOTE — Telephone Encounter (Signed)
Pt is SOB no other symptoms

## 2011-07-23 ENCOUNTER — Telehealth: Payer: Self-pay | Admitting: Cardiology

## 2011-07-23 NOTE — Telephone Encounter (Signed)
Please return call to patient at [phone] to discuss what she can take for congestion.

## 2011-07-23 NOTE — Telephone Encounter (Signed)
Spoke with pt. Pt states she is having a lot of nasal congestion not relieved with Claritin. Pt denies any edema and states her problem with SOB is related to nasal congestion. Pt does not feel congested in her lungs. She does not feel she has a sinus infection. Pt advised not to use decongestants.

## 2011-07-23 NOTE — Telephone Encounter (Signed)
Pt will try coricidin prn. If it does not relieve her symptoms she will contact her PCP.

## 2011-07-24 ENCOUNTER — Ambulatory Visit (INDEPENDENT_AMBULATORY_CARE_PROVIDER_SITE_OTHER): Payer: Medicare Other | Admitting: Pharmacist

## 2011-07-24 DIAGNOSIS — I4891 Unspecified atrial fibrillation: Secondary | ICD-10-CM

## 2011-07-24 MED ORDER — WARFARIN SODIUM 1 MG PO TABS: 1.0000 mg | ORAL_TABLET | ORAL | Status: DC

## 2011-07-27 ENCOUNTER — Other Ambulatory Visit (INDEPENDENT_AMBULATORY_CARE_PROVIDER_SITE_OTHER): Payer: Medicare Other

## 2011-07-27 ENCOUNTER — Other Ambulatory Visit: Payer: Self-pay

## 2011-07-27 DIAGNOSIS — I509 Heart failure, unspecified: Secondary | ICD-10-CM

## 2011-07-27 DIAGNOSIS — Z79899 Other long term (current) drug therapy: Secondary | ICD-10-CM

## 2011-07-27 LAB — BASIC METABOLIC PANEL
BUN: 52 mg/dL — ABNORMAL HIGH (ref 6–23)
Creatinine, Ser: 1.6 mg/dL — ABNORMAL HIGH (ref 0.4–1.2)
GFR: 32.35 mL/min — ABNORMAL LOW (ref >60.00)
Potassium: 4.2 mEq/L (ref 3.5–5.1)

## 2011-08-03 ENCOUNTER — Ambulatory Visit (INDEPENDENT_AMBULATORY_CARE_PROVIDER_SITE_OTHER): Payer: Medicare Other

## 2011-08-03 ENCOUNTER — Encounter: Payer: Self-pay | Admitting: Cardiology

## 2011-08-03 DIAGNOSIS — I4891 Unspecified atrial fibrillation: Secondary | ICD-10-CM

## 2011-08-03 LAB — POCT INR: INR: 3.8

## 2011-08-03 NOTE — Telephone Encounter (Deleted)
Error

## 2011-08-08 ENCOUNTER — Other Ambulatory Visit: Payer: Self-pay | Admitting: Cardiology

## 2011-08-08 NOTE — Telephone Encounter (Signed)
Pt is completely out of medication and she needs this called in because this was being taken care of at a [employer] and she wants it moved up here to [facility] on [address]

## 2011-08-09 ENCOUNTER — Telehealth: Payer: Self-pay | Admitting: Family Medicine

## 2011-08-09 MED ORDER — LEVOTHYROXINE SODIUM 100 MCG PO TABS: 100.0000 ug | ORAL_TABLET | Freq: Every day | ORAL | Status: DC

## 2011-08-09 NOTE — Telephone Encounter (Signed)
Pulled from Triage vmail - pt called at 10:02. She is on O2 24hrs a day. States he nose keeps filling with blood, which makes it hard to breathe. She wants to know if there is anything she can try, besides saline spray. Please advise.

## 2011-08-10 ENCOUNTER — Encounter: Payer: Self-pay | Admitting: Family

## 2011-08-10 ENCOUNTER — Telehealth: Payer: Self-pay

## 2011-08-10 ENCOUNTER — Ambulatory Visit (INDEPENDENT_AMBULATORY_CARE_PROVIDER_SITE_OTHER): Payer: Medicare Other | Admitting: Family

## 2011-08-10 ENCOUNTER — Ambulatory Visit (INDEPENDENT_AMBULATORY_CARE_PROVIDER_SITE_OTHER)
Admission: RE | Admit: 2011-08-10 | Discharge: 2011-08-10 | Disposition: A | Discharge status: Home / Self Care | Payer: Medicare Other | Source: Ambulatory Visit | Attending: Family | Admitting: Family

## 2011-08-10 VITALS — BP 130/78 | HR 65 | Ht <= 58 in | Wt 103.0 lb

## 2011-08-10 DIAGNOSIS — R0602 Shortness of breath: Secondary | ICD-10-CM

## 2011-08-10 DIAGNOSIS — I509 Heart failure, unspecified: Secondary | ICD-10-CM

## 2011-08-10 DIAGNOSIS — R609 Edema, unspecified: Secondary | ICD-10-CM

## 2011-08-10 MED ORDER — FUROSEMIDE 20 MG PO TABS: 20.0000 mg | ORAL_TABLET | Freq: Every day | ORAL | Status: DC

## 2011-08-10 NOTE — Telephone Encounter (Signed)
Schedule OV

## 2011-08-10 NOTE — Patient Instructions (Signed)
Peripheral Edema You have swelling in your legs (peripheral edema). This swelling is due to excess accumulation of salt and water in your body. Edema may be a sign of heart, kidney or liver disease, or a side effect of a medication. It may also be due to problems in the leg veins. Elevating your legs and using special support stockings may be very helpful, if the cause of the swelling is due to poor venous circulation. Avoid long periods of standing, whatever the cause. Treatment of edema depends on identifying the cause. Chips, pretzels, pickles and other salty foods should be avoided. Restricting salt in your diet is almost always needed. Water pills (diuretics) are often used to remove the excess salt and water from your body via urine. These medicines prevent the kidney from reabsorbing sodium. This increases urine flow. Diuretic treatment may also result in lowering of potassium levels in your body. Potassium supplements may be needed if you have to use diuretics daily. Daily weights can help you keep track of your progress in clearing your edema. You should call your caregiver for follow up care as recommended. SEEK IMMEDIATE MEDICAL CARE IF:   You have increased swelling, pain, redness, or heat in your legs.   You develop shortness of breath, especially when lying down.   You develop chest or abdominal pain, weakness, or fainting.   You have a fever.  Document Released: 04/05/2004 Document Revised: 02/15/2011 Document Reviewed: 03/16/2009 Exit[employer] Patient Information 2012 [employer]. 

## 2011-08-10 NOTE — Telephone Encounter (Signed)
Patient informed and states she will call back to schedule appt after she finds transportation

## 2011-08-10 NOTE — Telephone Encounter (Signed)
Patient states that she wears oxygen all day long and it is causing her nose to bleed and she can't get any oxygen. Patient states she uses saline solution and its not helping? Pt would like to know what else she could try? Please advise?

## 2011-08-10 NOTE — Progress Notes (Signed)
Subjective:    Patient ID: Janice Petersen, female    DOB: 08/25/1915, 78 y.o.   MRN: [id number]  HPI 78 year old white female, nonsmoker is in today with concerns of having a nosebleed in her right nostril. Since making the appointment, her nosebleed has stopped. She has concerns that she may not begin enough oxygen due to the nasal passageway being partially blocked. Patient saw cardiology recently who decreased her Lasix to 40 mg in the morning only from 40 mg twice daily. Since that time, she's began to have peripheral edema particularly to the left lower extremity. Patient reports shortness of breath with exertion. Reports her dosage being decreased related to abnormal renal function. Her last chest x-ray was in December 2012.   Review of Systems  Constitutional: Positive for fatigue.  HENT: Negative.   Respiratory: Negative.  Negative for cough.   Cardiovascular: Positive for leg swelling. Negative for chest pain and palpitations.  Gastrointestinal: Negative.   Genitourinary: Negative.   Musculoskeletal: Negative.   Skin: Negative.   Neurological: Negative.   Hematological: Negative.   Psychiatric/Behavioral: Negative.    Past Medical History  Diagnosis Date  . Hypertension   . Polymyalgia   . Abdominal discomfort   . Aortic insufficiency   . Renal artery stenosis   . Atrial fibrillation   . CAD (coronary artery disease)   . Hyperlipidemia   . Staphylococcal sepsis     History   Social History  . Marital Status: Widowed    Spouse Name: N/A    Number of Children: N/A  . Years of Education: N/A   Occupational History  . Not on file.   Social History Main Topics  . Smoking status: Former Games developer  . Smokeless tobacco: Not on file   Comment: quit in  the 1950's  . Alcohol Use: Yes  . Drug Use: No  . Sexually Active: Not on file   Other Topics Concern  . Not on file   Social History Narrative  . No narrative on file    Past Surgical History  Procedure Date  .  Appendectomy 1942  . Other surgical history 1957    HYSTERECTOMY    Family History  Problem Relation Age of Onset  . Heart disease Mother   . Cancer Father     Allergies  Allergen Reactions  . Dyazide (Hydrochlorothiazide W-Triamterene) Shortness Of Breath  . Procardia (Nifedipine) Shortness Of Breath    DIFFICULTY BREATHING  . Ace Inhibitors   . Amoxicillin-Pot Clavulanate   . Ciprofloxacin   . Codeine   . Norvasc (Amlodipine Besylate)   . Other     POSICOR  . Bactrim Rash  . Niacin And Related Rash  . Nitrofurantoin Monohyd Macro Rash  . Vancomycin Rash    Current Outpatient Prescriptions on File Prior to Visit  Medication Sig Dispense Refill  . Calcium Carbonate-Vitamin D (CALCIUM 500 + D PO) Take by mouth daily.      . carvedilol (COREG) 12.5 MG tablet Take 1 tablet (12.5 mg total) by mouth 2 (two) times daily.  60 tablet  6  . cloNIDine (CATAPRES) 0.2 MG tablet Take 1 tablet (0.2 mg total) by mouth 2 (two) times daily.  60 tablet  6  . Coenzyme Q10 (COQ10 PO) Take 400 mg by mouth.      . furosemide (LASIX) 40 MG tablet Take 1 tablet (40 mg total) by mouth daily.  1 tablet  0  . isosorbide mononitrate (IMDUR) 30 MG 24 hr tablet  Take 1 tablet (30 mg total) by mouth daily.  30 tablet  6  . levothyroxine (SYNTHROID, LEVOTHROID) 100 MCG tablet Take 1 tablet (100 mcg total) by mouth daily. 1/2 tab daily  30 tablet  3  . lisinopril (PRINIVIL,ZESTRIL) 20 MG tablet Take 20 mg by mouth 2 (two) times daily.      Marland Kitchen lovastatin (MEVACOR) 20 MG tablet Take 20 mg by mouth at bedtime.      . Magnesium Oxide 200 MG TABS Take one tablet daily    0  . Multiple Vitamins-Minerals (CENTRUM SILVER PO) Take by mouth daily.      . potassium chloride SA (KLOR-CON M20) 20 MEQ tablet Take 1 tablet (20 mEq total) by mouth daily.      Marland Kitchen warfarin (COUMADIN) 1 MG tablet Take 1 tablet (1 mg total) by mouth as directed.  30 tablet  1    BP 130/78  Pulse 65  Ht 4\' 8"  (1.422 m)  Wt 103 lb (46.72 kg)   BMI 23.09 kg/m2  SpO2 73%chart    Objective:   Physical Exam  Constitutional: She is oriented to person, place, and time. She appears well-developed and well-nourished.  HENT:  Right Ear: External ear normal.  Left Ear: External ear normal.  Nose: Nose normal.  Mouth/Throat: Oropharynx is clear and moist.  Neck: Normal range of motion. Neck supple.  Cardiovascular: Normal rate, regular rhythm and normal heart sounds.   Pulmonary/Chest: Effort normal and breath sounds normal.       Patient is currently satting 84% on 3 L nasal cannula  Abdominal: Soft. Bowel sounds are normal.  Musculoskeletal: She exhibits edema.       2+ pitting edema noted to the left lower extremity. Very mild edema noted to the right.  Neurological: She is alert and oriented to person, place, and time.  Skin: Skin is warm and dry.  Psychiatric: She has a normal mood and affect.          Assessment & Plan:  Assessment: Hypoxemia, CHF, COPD, peripheral edema-likely related to recent medication adjustment.  Plan: At current, patient said 84% on 3 L. We'll get a chest x-ray to rule out pneumonia. I believe the underlying cause of her edema is to decrease the dosage of Lasix. I have contacted [facility] cardiology however, Dr. Shirlee Latch is out of the office and so is his nurse. After discussing the case with Dr. Caryl Never, we have agreed to continue the Lasix 40 mg in the morning but add 20 mg in the evening. Today, 40 mg of Lasix by mouth this evening only. I will bring patient back for recheck on Monday. If her x-ray shows any acute abnormality we will address it sooner. Continue continuous O2. Patient advised to go to the emergency department for symptoms worsen.

## 2011-08-13 ENCOUNTER — Other Ambulatory Visit (INDEPENDENT_AMBULATORY_CARE_PROVIDER_SITE_OTHER): Payer: Medicare Other

## 2011-08-13 ENCOUNTER — Ambulatory Visit (INDEPENDENT_AMBULATORY_CARE_PROVIDER_SITE_OTHER): Payer: Medicare Other | Admitting: Family

## 2011-08-13 ENCOUNTER — Other Ambulatory Visit: Payer: Medicare Other

## 2011-08-13 DIAGNOSIS — I509 Heart failure, unspecified: Secondary | ICD-10-CM

## 2011-08-13 DIAGNOSIS — I4891 Unspecified atrial fibrillation: Secondary | ICD-10-CM

## 2011-08-13 LAB — POCT INR: INR: 2.5

## 2011-08-13 LAB — BASIC METABOLIC PANEL
Calcium: 8.9 mg/dL (ref 8.4–10.5)
Creatinine, Ser: 1.6 mg/dL — ABNORMAL HIGH (ref 0.4–1.2)

## 2011-08-13 NOTE — Patient Instructions (Signed)
  Latest dosing instructions   Total Sun Mon Tue Wed Thu Fri Sat   5 0.5 mg 0.5 mg 1 mg 0.5 mg 1 mg 0.5 mg 1 mg    (1 mg0.5) (1 mg0.5) (1 mg1) (1 mg0.5) (1 mg1) (1 mg0.5) (1 mg1)

## 2011-08-13 NOTE — Telephone Encounter (Signed)
Bland Span, this is still on my desktop. Do you know if it has been taken care of? Thanks!

## 2011-08-14 NOTE — Telephone Encounter (Signed)
Yes, it was. Pt had an appointment that day

## 2011-08-15 ENCOUNTER — Other Ambulatory Visit: Payer: Self-pay | Admitting: Family

## 2011-08-15 ENCOUNTER — Telehealth: Payer: Self-pay

## 2011-08-15 MED ORDER — DOXYCYCLINE HYCLATE 100 MG PO TABS: 100.0000 mg | ORAL_TABLET | Freq: Two times a day (BID) | ORAL | Status: AC

## 2011-08-15 NOTE — Telephone Encounter (Signed)
Message copied by Beverely Low on Wed Aug 15, 2011 11:12 AM ------      Message from: Janice Petersen      Created: Wed Aug 15, 2011  9:29 AM       Doxycycline 100mg  BID x 10 days. Patient has pneumonia

## 2011-08-15 NOTE — Telephone Encounter (Signed)
Pt aware.

## 2011-08-22 ENCOUNTER — Telehealth: Payer: Self-pay | Admitting: Family

## 2011-08-22 NOTE — Telephone Encounter (Signed)
Caller: Henslee/Patient; CB#: [phone]; Has had diarrhea x 24 hours.  Same is watery.  Has had 4 stools today.  She is Doxycycline for pneumonia x 1 week.  She is drinking liquids and has urinated today.   She is going to push liquids and take an OTC antidiarrheal medication.  If no improvment, will call for appt. in am.

## 2011-08-23 ENCOUNTER — Telehealth: Payer: Self-pay | Admitting: Cardiology

## 2011-08-23 NOTE — Telephone Encounter (Signed)
Pt recently (5/31)saw PCP for edema and her Furosemide was increased to 40/20.  She was Dx with pneumonia from a CXR due that same day.  She was started on Doxycycline 100 mg BID, 6/12 she reported having diarrhea and was told to push fluids.  Today she calls with edema and SOB.  She wanted to let Dr Shirlee Latch know she would like to go back to 40 mg BID of Furosemide.  Informed pt that I will forward to MD for review and orders.

## 2011-08-23 NOTE — Telephone Encounter (Signed)
Please call and check on patient.

## 2011-08-23 NOTE — Telephone Encounter (Signed)
New Problem:    Patient called in because her legs are swelling again and would like to know how to proceed.  Please call back.

## 2011-08-23 NOTE — Telephone Encounter (Signed)
She can go back to Lasix 40 bid but needs followup next week with PA or myself and a BMET next week.

## 2011-08-24 NOTE — Telephone Encounter (Signed)
We can consider a swallowing evaluation or a referral to GI. If she has concerns about her not feeling well. Schedule an appointment and we can check her.

## 2011-08-24 NOTE — Telephone Encounter (Signed)
Pt states that the diarrhea is gone but her food seems to get stuck in the middle of her chest for a while and eventually it goes down, but it makes her feel bad. She also says that she just doesn't feel good. She is staying in bed and continuing to get plenty of fluids. She was also concerned with her potassium, however, labs from last weeks so that her potassium is normal. Please advise

## 2011-08-24 NOTE — Telephone Encounter (Signed)
Spoke with pt and she would rather see NP before considering a swallowing eval or ref to GI. Appointment scheduled.  Pt also notes that she cancelled her PT/INR appt on the 24th in this office because she has an appt at [facility], therefore she will get it done there

## 2011-08-26 ENCOUNTER — Other Ambulatory Visit: Payer: Self-pay | Admitting: Cardiology

## 2011-08-27 ENCOUNTER — Ambulatory Visit: Payer: Medicare Other | Admitting: Family

## 2011-09-03 ENCOUNTER — Ambulatory Visit (INDEPENDENT_AMBULATORY_CARE_PROVIDER_SITE_OTHER): Payer: Medicare Other

## 2011-09-03 ENCOUNTER — Encounter: Payer: Self-pay | Admitting: Physician Assistant

## 2011-09-03 ENCOUNTER — Encounter: Payer: Medicare Other | Admitting: Family

## 2011-09-03 ENCOUNTER — Ambulatory Visit (INDEPENDENT_AMBULATORY_CARE_PROVIDER_SITE_OTHER): Payer: Medicare Other | Admitting: Physician Assistant

## 2011-09-03 VITALS — BP 94/50 | HR 54 | Ht <= 58 in | Wt 91.1 lb

## 2011-09-03 DIAGNOSIS — I2510 Atherosclerotic heart disease of native coronary artery without angina pectoris: Secondary | ICD-10-CM

## 2011-09-03 DIAGNOSIS — N189 Chronic kidney disease, unspecified: Secondary | ICD-10-CM

## 2011-09-03 DIAGNOSIS — I509 Heart failure, unspecified: Secondary | ICD-10-CM

## 2011-09-03 DIAGNOSIS — I4891 Unspecified atrial fibrillation: Secondary | ICD-10-CM

## 2011-09-03 LAB — BASIC METABOLIC PANEL
Chloride: 102 mEq/L (ref 96–112)
Potassium: 3.6 mEq/L (ref 3.5–5.1)

## 2011-09-03 LAB — POCT INR: INR: 4.1

## 2011-09-03 MED ORDER — FUROSEMIDE 40 MG PO TABS: ORAL_TABLET | ORAL | Status: DC

## 2011-09-03 NOTE — Patient Instructions (Addendum)
KEEP APPOINTMENT TO SEE DR. MCLEAN ON 10/10/11  Your physician has recommended you make the following change in your medication: CHANGE LASIX TO 40 MG EVERY OTHER DAY; ONE DAY TAKE LASIX 40 MG TWICE DAILY; THEN ON OTHER DAY TAKE LASIX 40 MG DAILY; HOWEVER IF YOU SEE AFTER A FEW DOSES THAT YOU HAVE MORE SWELLING OR SOB OR WEIGHT HAS INCREASED GO BACK TO LASIX 40 MG TWICE DAILY   BMET TODAY

## 2011-09-03 NOTE — Progress Notes (Signed)
[address] [place], [place]  [address] Phone: [phone] Fax:  [phone]  Date:  09/03/2011   Name:  Janice Petersen   DOB:  05-01-1915   MRN:  [id number]  PCP:  Janell Quiet, FNP  Primary Cardiologist:  Dr. [name]  Primary Electrophysiologist:  None    History of Present Illness: Janice Petersen is a 78 y.o. female who returns for follow up of edema.    She has a history of diastolic CHF, valvular heart disease, CAD, and chronic atrial fibrillation. She used to live in [facility] and was followed by a cardiologist down there. She has recently moved to [facility] to be nearer her family (currently living with one of her sons). She had a stent placed in her RCA in 2004. She has had a long history of valvular disease, last echo showed moderate to severe MR and moderate to severe TR with severe pulmonary hypertension. She has a history of diastolic CHF. She has been in atrial fibrillation since 2003 on warfarin.   Several months ago, Janice Petersen developed worsening exertional dyspnea. She was seen by her PCP in [facility] and started on home oxygen. She also developed worsening lower extremity swelling with venous stasis changes. Prior to this, she was actually walking 1/2 mile/day. When Dr. Shirlee Latch initially saw her, she looked quite volume overloaded and he increased her Lasix to 40 mg po bid. Her leg edema mostly resolved. She uses compression stockings. Her venous stasis ulcers have healed and she was dismissed from the wound care clinic.   Last saw Dr. [name] in 07/2011 and Lasix was decreased.  Patient started to note "fluid accumulation" after this.  She developed pneumonia recently and had worsening dyspnea.  Notes Class 3 symptoms.  She is much improved since taking antibiotics.  She also increased her Lasix back to 40 mg bid.  She feels much better on the higher dose.  Still bothered by frequent trips to the BR.  She sleep on 2 pillows.  No  PND.  No chest pain.  No syncope.  No fatigue or dizziness.  She states she feels much better.    Wt Readings from Last 3 Encounters:  09/03/11 91 lb 1.9 oz (41.332 kg)  08/10/11 103 lb (46.72 kg)  07/10/11 97 lb (43.999 kg)     Potassium  Date/Time Value Range Status  08/13/2011  2:01 PM 4.1  3.5 - 5.1 mEq/L Final     Creatinine, Ser  Date/Time Value Range Status  08/13/2011  2:01 PM 1.6* 0.4 - 1.2 mg/dL Final   Past Medical History: 1. Diastolic CHF: Echo (10/12) with EF 65%, mild AI, moderate to severe MR, moderate to severe TR, PA systolic pressure 84 mmHg. She has been on home oxygen since 1/13.  2. Chronic atrial fibrillation since 2003.  3. PMR  4. H/o appendectomy  5. TAH  6. Renal artery stenosis: 50% left renal artery stenosis found on angiogram in 8/02.  7. Hypothyroidism  8. MRSA sepsis in 2006  9. Hyperlipidemia  10. H/o PE  11. CAD: Cypher DES to proximal RCA in 2/04. Last Lexiscan myoview in 2011 showed EF 67%, normal.  12. Valvular heart disease: Last echo in 10/12 showed moderate to severe MR, moderate to severe TR.  13. Pulmonary hypertension: Suspect secondary to elevated left atrial pressure as well as probable reactive pulmonary vascular changes.  14. CKD   Current Outpatient Prescriptions  Medication Sig Dispense Refill  .  Calcium Carbonate-Vitamin D (CALCIUM 500 + D PO) Take by mouth daily.      . carvedilol (COREG) 12.5 MG tablet Take 1 tablet (12.5 mg total) by mouth 2 (two) times daily.  60 tablet  6  . cloNIDine (CATAPRES) 0.2 MG tablet Take 1 tablet (0.2 mg total) by mouth 2 (two) times daily.  60 tablet  6  . Coenzyme Q10 (COQ10 PO) Take 400 mg by mouth.      . furosemide (LASIX) 40 MG tablet Take 40 mg by mouth 2 (two) times daily.   1 tablet  0  . isosorbide mononitrate (IMDUR) 30 MG 24 hr tablet Take 1 tablet (30 mg total) by mouth daily.  30 tablet  6  . levothyroxine (SYNTHROID, LEVOTHROID) 100 MCG tablet Take 1 tablet (100 mcg total) by mouth  daily. 1/2 tab daily  30 tablet  3  . lisinopril (PRINIVIL,ZESTRIL) 20 MG tablet TAKE 1 TABLET BY MOUTH TWICE DAILY  180 tablet  0  . lovastatin (MEVACOR) 20 MG tablet Take 20 mg by mouth at bedtime.      . Magnesium Oxide 200 MG TABS Take one tablet daily    0  . Multiple Vitamins-Minerals (CENTRUM SILVER PO) Take by mouth daily.      . potassium chloride SA (KLOR-CON M20) 20 MEQ tablet Take 1 tablet (20 mEq total) by mouth daily.      Marland Kitchen warfarin (COUMADIN) 1 MG tablet Take 1 tablet (1 mg total) by mouth as directed.  30 tablet  1  . DISCONTD: furosemide (LASIX) 20 MG tablet Take 1 tablet (20 mg total) by mouth daily.  30 tablet  3    Allergies: Allergies  Allergen Reactions  . Dyazide (Hydrochlorothiazide W-Triamterene) Shortness Of Breath  . Procardia (Nifedipine) Shortness Of Breath    DIFFICULTY BREATHING  . Ace Inhibitors   . Amoxicillin-Pot Clavulanate   . Ciprofloxacin   . Codeine   . Norvasc (Amlodipine Besylate)   . Other     POSICOR  . Bactrim Rash  . Niacin And Related Rash  . Nitrofurantoin Monohyd Macro Rash  . Vancomycin Rash    History  Substance Use Topics  . Smoking status: Former Games developer  . Smokeless tobacco: Not on file   Comment: quit in  the 1950's  . Alcohol Use: Yes     ROS:  Please see the history of present illness.     All other systems reviewed and negative.   PHYSICAL EXAM: VS:  BP 94/50  Pulse 54  Ht 4\' 10"  (1.473 m)  Wt 91 lb 1.9 oz (41.332 kg)  BMI 19.04 kg/m2 Well nourished, well developed, in no acute distress HEENT: normal Neck: no JVD at 90 degrees Cardiac:  normal S1, S2; irregularly irregular, 2/6 systolic murmur along LSB Lungs:  clear to auscultation bilaterally, no wheezing, rhonchi or rales Abd: soft, nontender, no hepatomegaly Ext: trace bilateral LE edema Skin: warm and dry Neuro:  CNs 2-12 intact, no focal abnormalities noted  EKG:  AFib, HR 54, non-specific ST-T changes   ASSESSMENT AND PLAN:  1.  Chronic  diastolic CHF Volume appears stable.  She is somewhat troubled about frequent urination.  We discussed taking Lasix every 6 hours.  She would like to try taking her Lasix 40 mg once daily every other day while remaining on 40 mg twice a day all other days.  She can try this.  If she notes that her lower extremity edema increases or she becomes more short  of breath, she will resume 40 mg twice a day every day.  Check a basic metabolic panel today.  Follow up with Dr. Shirlee Latch as scheduled next month.  2.  Permanent atrial fibrillation Rate controlled.  She remains on Coumadin.  3.  Coronary artery disease No angina.  Continue statin.  She is not on aspirin as she is already on Coumadin.  4.  Chronic kidney disease Keep a close eye on her renal function and potassium.  Check a basic metabolic panel today.   Signed, Tereso Newcomer, PA-C  1:19 PM 09/03/2011

## 2011-09-17 ENCOUNTER — Ambulatory Visit (INDEPENDENT_AMBULATORY_CARE_PROVIDER_SITE_OTHER): Payer: Medicare Other | Admitting: *Deleted

## 2011-09-17 DIAGNOSIS — I4891 Unspecified atrial fibrillation: Secondary | ICD-10-CM

## 2011-09-17 LAB — POCT INR: INR: 1.5

## 2011-09-23 ENCOUNTER — Other Ambulatory Visit: Payer: Self-pay | Admitting: Cardiology

## 2011-09-24 ENCOUNTER — Ambulatory Visit (INDEPENDENT_AMBULATORY_CARE_PROVIDER_SITE_OTHER): Payer: Medicare Other

## 2011-09-24 DIAGNOSIS — I4891 Unspecified atrial fibrillation: Secondary | ICD-10-CM

## 2011-09-24 LAB — POCT INR: INR: 1.6

## 2011-10-10 ENCOUNTER — Encounter: Payer: Self-pay | Admitting: Cardiology

## 2011-10-10 ENCOUNTER — Ambulatory Visit (INDEPENDENT_AMBULATORY_CARE_PROVIDER_SITE_OTHER): Payer: Medicare Other | Admitting: *Deleted

## 2011-10-10 ENCOUNTER — Ambulatory Visit (INDEPENDENT_AMBULATORY_CARE_PROVIDER_SITE_OTHER): Payer: Medicare Other | Admitting: Cardiology

## 2011-10-10 VITALS — BP 126/66 | HR 73 | Ht <= 58 in | Wt 88.0 lb

## 2011-10-10 DIAGNOSIS — R0602 Shortness of breath: Secondary | ICD-10-CM

## 2011-10-10 DIAGNOSIS — I4891 Unspecified atrial fibrillation: Secondary | ICD-10-CM

## 2011-10-10 DIAGNOSIS — E785 Hyperlipidemia, unspecified: Secondary | ICD-10-CM

## 2011-10-10 DIAGNOSIS — I272 Pulmonary hypertension, unspecified: Secondary | ICD-10-CM

## 2011-10-10 DIAGNOSIS — I38 Endocarditis, valve unspecified: Secondary | ICD-10-CM

## 2011-10-10 DIAGNOSIS — I2789 Other specified pulmonary heart diseases: Secondary | ICD-10-CM

## 2011-10-10 DIAGNOSIS — I2510 Atherosclerotic heart disease of native coronary artery without angina pectoris: Secondary | ICD-10-CM

## 2011-10-10 DIAGNOSIS — I5032 Chronic diastolic (congestive) heart failure: Secondary | ICD-10-CM

## 2011-10-10 DIAGNOSIS — I509 Heart failure, unspecified: Secondary | ICD-10-CM

## 2011-10-10 LAB — POCT INR: INR: 1.6

## 2011-10-10 MED ORDER — FUROSEMIDE 40 MG PO TABS: ORAL_TABLET | ORAL | Status: DC

## 2011-10-10 MED ORDER — BISOPROLOL FUMARATE 10 MG PO TABS: 10.0000 mg | ORAL_TABLET | Freq: Every day | ORAL | Status: DC

## 2011-10-10 NOTE — Patient Instructions (Addendum)
Stop coreg(carvedilol).  Start bisoprolol 10mg  daily.  Decrease lasix(furosemide) to alternate every other day--lasix 40mg  in the morning and 20mg  in the afternoon with 40mg  in the morning the next day.  Your physician recommends that you have  lab work today--BMET/BNP  Your physician recommends that you schedule a follow-up appointment in: 3 months with Dr Shirlee Latch.

## 2011-10-11 DIAGNOSIS — I38 Endocarditis, valve unspecified: Secondary | ICD-10-CM | POA: Insufficient documentation

## 2011-10-11 LAB — BRAIN NATRIURETIC PEPTIDE: Pro B Natriuretic peptide (BNP): 441 pg/mL — ABNORMAL HIGH (ref 0.0–100.0)

## 2011-10-11 LAB — BASIC METABOLIC PANEL
CO2: 27 mEq/L (ref 19–32)
Calcium: 9.3 mg/dL (ref 8.4–10.5)
Creatinine, Ser: 1.3 mg/dL — ABNORMAL HIGH (ref 0.4–1.2)
GFR: 40.5 mL/min — ABNORMAL LOW (ref >60.00)

## 2011-10-11 NOTE — Assessment & Plan Note (Signed)
Moderate to severe MR and TR, likely significant contributors to diastolic CHF and pulmonary HTN.  Not an operative candidate.

## 2011-10-11 NOTE — Assessment & Plan Note (Signed)
Probably secondary to diastolic LV failure with elevated LA pressure and reactive pulmonary artery changes.

## 2011-10-11 NOTE — Assessment & Plan Note (Signed)
LDL at goal (< 70) when last checked.  

## 2011-10-11 NOTE — Progress Notes (Signed)
Patient ID: Janice Petersen, female   DOB: 10/25/1915, 78 y.o.   MRN: [id number] PCP: Alm  78 yo with history of diastolic CHF, valvular heart disease, CAD, and chronic atrial fibrillation presents for cardiology evaluation.  She used to live in [facility] and was followed by a cardiologist down there.  She has recently moved to [facility] to be nearer her family (currently living with one of her sons).  She had a stent placed in her RCA in 2004.  She has had a long history of valvular disease, last echo showed moderate to severe MR and moderate to severe TR with severe pulmonary hypertension.  She has a history of diastolic CHF.  She has been in atrial fibrillation since 2003 on warfarin.  Since she was last seen in this office, Janice Petersen has been stable.  She is taking Lasix 40 bid alternating with 40 daily.  Creatinine when last checked was 1.4.  Weight is down 3 lbs.  She wants to decrease her pm Lasix dose as it is keeping her up at night.  She denies exertional dyspnea walking around her house and she can walk up a flight of steps without significant dyspnea.  Leg edema is down.  She also is concerned about loose stool that she has been having ever since she started Coreg in 2/13.  She has 2-3 loose BMs a day.  She is concerned that Coreg is causing this.    Labs (2/13): K 4.1, creatinine 1.1 => 1.4, proBNP 683, LDL 54, HDL 44 Labs (4/13): Creatinine 1.87, K 4.9, BNP 486 Labs (6/13): K 3.6, creatinine 1.4  PMH: 1. Diastolic CHF: Echo (10/12) with EF 65%, mild AI, moderate to severe MR, moderate to severe TR, PA systolic pressure 84 mmHg.  She has been on home oxygen since 1/13.  2. Chronic atrial fibrillation since 2003. 3. PMR 4. H/o appendectomy 5. TAH 6. Renal artery stenosis: 50% left renal artery stenosis found on angiogram in 8/02.  7. Hypothyroidism 8. MRSA sepsis in 2006 9. Hyperlipidemia 10. H/o PE 11. CAD: Cypher DES to proximal RCA in 2/04.  Last Lexiscan myoview in 2011 showed EF  67%, normal.  12. Valvular heart disease: Last echo in 10/12 showed moderate to severe MR, moderate to severe TR.  13. Pulmonary hypertension: Suspect secondary to elevated left atrial pressure as well as probable reactive pulmonary vascular changes.  14. CKD  SH: Widow. Moved from [place] to [place] and living with her son.  Has 3 sons.  Former smoker (quit years ago).  Rare ETOH.   FH: Mother with CHF at 79, father with multiple myeloma.   ROS: All systems reviewed and negative except as per HPI.   Current Outpatient Prescriptions  Medication Sig Dispense Refill  . Calcium Carbonate-Vitamin D (CALCIUM 500 + D PO) Take by mouth daily.      . cloNIDine (CATAPRES) 0.2 MG tablet Take 1 tablet (0.2 mg total) by mouth 2 (two) times daily.  60 tablet  6  . Coenzyme Q10 (COQ10 PO) Take 400 mg by mouth.      . isosorbide mononitrate (IMDUR) 30 MG 24 hr tablet Take 1 tablet (30 mg total) by mouth daily.  30 tablet  6  . levothyroxine (SYNTHROID, LEVOTHROID) 100 MCG tablet Take 1 tablet (100 mcg total) by mouth daily. 1/2 tab daily  30 tablet  3  . lisinopril (PRINIVIL,ZESTRIL) 20 MG tablet TAKE 1 TABLET BY MOUTH TWICE DAILY  180 tablet  0  .  lovastatin (MEVACOR) 20 MG tablet Take 20 mg by mouth at bedtime.      . Multiple Vitamins-Minerals (CENTRUM SILVER PO) Take by mouth daily.      . potassium chloride SA (KLOR-CON M20) 20 MEQ tablet Take 1 tablet (20 mEq total) by mouth daily.      . SF 5000 PLUS 1.1 % CREA dental cream Daily.      Marland Kitchen warfarin (COUMADIN) 1 MG tablet TAKE 1 TABLET BY MOUTH AS DIRECTED  30 tablet  2  . bisoprolol (ZEBETA) 10 MG tablet Take 1 tablet (10 mg total) by mouth daily.  30 tablet  6  . furosemide (LASIX) 40 MG tablet Alternate every other day -40mg  in the morning and 20mg  in the afternoon with 40mg  in the morning the next day. This will be one 40mg  tablet in the morning and one-half 40mg  tablet in the afternoon with one 40mg  tablet in the morning the next day  50  tablet  6    BP 126/66  Pulse 73  Ht 4\' 10"  (1.473 m)  Wt 88 lb (39.917 kg)  BMI 18.39 kg/m2  SpO2 89% General: frail, NAD Neck: JVP 8 cm, no thyromegaly or thyroid nodule.  Lungs: Bilateral basilar crackles CV: Nondisplaced PMI.  Heart irregular S1/S2, no S3/S4, 3/6 HSM LLSB/apex.  Trace left ankle edema (improved).  No carotid bruit.  Normal pedal pulses.  Abdomen: Soft, nontender, no hepatosplenomegaly, no distention.  Skin: Unable to assess ulcerations, she is wearing compression stockings.    Neurologic: Alert and oriented x 3.  Psych: Normal affect. Extremities: No clubbing or cyanosis.

## 2011-10-11 NOTE — Assessment & Plan Note (Signed)
Chronic atrial fibrillation.  Good rate control.  Continue warfarin.  She has been on Coreg but is concerned that it is causing diarrhea.  That is a possible side effect, and loose stools date back to 2/13 when she started Coreg.  Therefore, will stop Coreg and have her take bisoprolol 10 mg daily instead.

## 2011-10-11 NOTE — Assessment & Plan Note (Signed)
Stable with no chest pain.  Continue warfarin (no indication to add ASA), statin, beta blocker, ACEI.

## 2011-10-11 NOTE — Assessment & Plan Note (Addendum)
Stable NYHA class II-III symptoms.  Weight is down.  She is not markedly volume overloaded.  I think that it would be reasonable to decrease her Lasix to 40 qam/20 qpm alternating with 40 daily.  This hopefully will help with her nocturia.  She will need to follow her weight and also contact us if she develops more dyspnea.  I will check a BMET and BNP today.

## 2011-10-13 ENCOUNTER — Telehealth: Payer: Self-pay | Admitting: Physician Assistant

## 2011-10-13 NOTE — Telephone Encounter (Signed)
Pt called in this morning - she feels well currently without symptoms but had symptoms of chilliness, some SOB last night. She reports a recent change to bisoprolol (coreg was giving her diarrhea). Overnight she felt chilly (without shaking chills/rigors), her breathing didn't seem quite right and she was able to hear her HR in her ears. She was able to go to sleep and woke up feeling fine. She wonders if the bisoprolol is the culprit. Typically this medicine is quite selective over coreg and I'm not sure if it caused changes in her breathing. I'm not sure if her symptoms last night are correlated to the bisoprolol change, and she does not want to go back to the Coreg. Note no exertional dyspnea now. No fever, chills, chest pain, or palpitations. I advised that she try bisoprolol again today and let us know if symptoms recur or if anything different happens. She verbalized understanding and gratitude. Severa Jeremiah PA-C

## 2011-10-16 ENCOUNTER — Other Ambulatory Visit: Payer: Self-pay | Admitting: Cardiology

## 2011-10-22 ENCOUNTER — Ambulatory Visit (INDEPENDENT_AMBULATORY_CARE_PROVIDER_SITE_OTHER): Payer: Medicare Other | Admitting: Pharmacist

## 2011-10-22 DIAGNOSIS — I4891 Unspecified atrial fibrillation: Secondary | ICD-10-CM

## 2011-10-22 LAB — POCT INR: INR: 1.6

## 2011-10-30 ENCOUNTER — Ambulatory Visit: Payer: Medicare Other | Admitting: Family

## 2011-11-01 ENCOUNTER — Ambulatory Visit (INDEPENDENT_AMBULATORY_CARE_PROVIDER_SITE_OTHER): Payer: Medicare Other | Admitting: *Deleted

## 2011-11-01 DIAGNOSIS — I4891 Unspecified atrial fibrillation: Secondary | ICD-10-CM

## 2011-11-06 ENCOUNTER — Telehealth: Payer: Self-pay | Admitting: Cardiology

## 2011-11-06 NOTE — Telephone Encounter (Signed)
Busy signal

## 2011-11-06 NOTE — Telephone Encounter (Signed)
Ok to use o2 pulser.

## 2011-11-06 NOTE — Telephone Encounter (Signed)
PT CALLING RE QUESTION, WILL BE TRAVELING BY PLANE, WILL HAVE A CONCENTRATOR TO TAKE WITH HER, CAN SHE HAVE IT ON PULSE INSTEAD OF CONSTANT

## 2011-11-14 ENCOUNTER — Ambulatory Visit (INDEPENDENT_AMBULATORY_CARE_PROVIDER_SITE_OTHER): Payer: Medicare Other | Admitting: Pharmacist

## 2011-11-14 DIAGNOSIS — I4891 Unspecified atrial fibrillation: Secondary | ICD-10-CM

## 2011-11-23 ENCOUNTER — Ambulatory Visit (INDEPENDENT_AMBULATORY_CARE_PROVIDER_SITE_OTHER): Payer: Medicare Other | Admitting: *Deleted

## 2011-11-23 DIAGNOSIS — I4891 Unspecified atrial fibrillation: Secondary | ICD-10-CM

## 2011-11-23 LAB — POCT INR: INR: 2.4

## 2011-12-04 ENCOUNTER — Other Ambulatory Visit: Payer: Self-pay | Admitting: Cardiology

## 2011-12-11 ENCOUNTER — Ambulatory Visit (INDEPENDENT_AMBULATORY_CARE_PROVIDER_SITE_OTHER): Payer: Medicare Other | Admitting: *Deleted

## 2011-12-11 DIAGNOSIS — I4891 Unspecified atrial fibrillation: Secondary | ICD-10-CM

## 2011-12-18 ENCOUNTER — Other Ambulatory Visit: Payer: Self-pay | Admitting: Cardiology

## 2011-12-19 ENCOUNTER — Other Ambulatory Visit: Payer: Self-pay | Admitting: Cardiology

## 2012-01-02 ENCOUNTER — Ambulatory Visit: Payer: Medicare Other | Admitting: Cardiology

## 2012-01-03 ENCOUNTER — Ambulatory Visit (INDEPENDENT_AMBULATORY_CARE_PROVIDER_SITE_OTHER): Payer: Medicare Other | Admitting: Cardiology

## 2012-01-03 ENCOUNTER — Ambulatory Visit (INDEPENDENT_AMBULATORY_CARE_PROVIDER_SITE_OTHER): Payer: Medicare Other | Admitting: Pharmacist

## 2012-01-03 VITALS — BP 120/50 | HR 55 | Ht <= 58 in | Wt 87.0 lb

## 2012-01-03 DIAGNOSIS — I272 Pulmonary hypertension, unspecified: Secondary | ICD-10-CM

## 2012-01-03 DIAGNOSIS — I4891 Unspecified atrial fibrillation: Secondary | ICD-10-CM

## 2012-01-03 DIAGNOSIS — I5032 Chronic diastolic (congestive) heart failure: Secondary | ICD-10-CM

## 2012-01-03 DIAGNOSIS — I38 Endocarditis, valve unspecified: Secondary | ICD-10-CM

## 2012-01-03 DIAGNOSIS — I2789 Other specified pulmonary heart diseases: Secondary | ICD-10-CM

## 2012-01-03 DIAGNOSIS — I059 Rheumatic mitral valve disease, unspecified: Secondary | ICD-10-CM

## 2012-01-03 DIAGNOSIS — E785 Hyperlipidemia, unspecified: Secondary | ICD-10-CM

## 2012-01-03 DIAGNOSIS — I10 Essential (primary) hypertension: Secondary | ICD-10-CM

## 2012-01-03 DIAGNOSIS — I2510 Atherosclerotic heart disease of native coronary artery without angina pectoris: Secondary | ICD-10-CM

## 2012-01-03 DIAGNOSIS — I509 Heart failure, unspecified: Secondary | ICD-10-CM

## 2012-01-03 NOTE — Patient Instructions (Addendum)
Decrease your clonidine to 0.1mg  twice a day.  Your physician has requested that you  monitor and record your blood pressure readings everyday at home. Please use the same machine at the same time of day to check your readings and record them to bring to your follow-up visit.  If your blood pressure goes up to 140 ( top number) you will need to increase your clonidine back to 0.2mg  twice a day.  Follow-up with Dr. Shirlee Latch in 4 months

## 2012-01-04 ENCOUNTER — Encounter: Payer: Self-pay | Admitting: Cardiology

## 2012-01-04 NOTE — Progress Notes (Signed)
Patient ID: Janice Petersen, female   DOB: [birth year], 78 y.o.   MRN: [id number] PCP: Janice Petersen  78 yo with history of diastolic CHF, valvular heart disease, CAD, and chronic atrial fibrillation presents for cardiology evaluation.  She used to live in [facility] and was followed by a cardiologist down there.  She has recently moved to [facility] to be nearer her family (currently living with one of her sons).  She had a stent placed in her RCA in 2004.  She has had a long history of valvular disease, last echo showed moderate to severe MR and moderate to severe TR with severe pulmonary hypertension.  She has a history of diastolic CHF.  She has been in atrial fibrillation since 2003 on warfarin.  Since she was last seen in this office, Janice Petersen has been stable.  She is taking Lasix 40 qam/20 qpm alternating with 40 daily.  Creatinine when last checked was 1.3.  Weight is down 1 lb.  She wants to decrease her pm Lasix dose as it is keeping her up at night.  She denies exertional dyspnea walking around her house and she can walk up a flight of steps without significant dyspnea.  Leg edema is down.  Loose stool has resolved off Coreg.  She recently had a squamous cell CA excised from her right lower leg.    Labs (2/13): K 4.1, creatinine 1.1 => 1.4, proBNP 683, LDL 54, HDL 44 Labs (4/13): Creatinine 1.87, K 4.9, BNP 486 Labs (6/13): K 3.6, creatinine 1.4  PMH: 1. Diastolic CHF: Echo (10/12) with EF 65%, mild AI, moderate to severe MR, moderate to severe TR, PA systolic pressure 84 mmHg.  She has been on home oxygen since 1/13.  2. Chronic atrial fibrillation since 2003. 3. PMR 4. H/o appendectomy 5. TAH 6. Renal artery stenosis: 50% left renal artery stenosis found on angiogram in 8/02.  7. Hypothyroidism 8. MRSA sepsis in 2006 9. Hyperlipidemia 10. H/o PE 11. CAD: Cypher DES to proximal RCA in 2/04.  Last Lexiscan myoview in 2011 showed EF 67%, normal.  12. Valvular heart disease: Last echo in 10/12  showed moderate to severe MR, moderate to severe TR.  13. Pulmonary hypertension: Suspect secondary to elevated left atrial pressure as well as probable reactive pulmonary vascular changes.  14. CKD 15. Squamous cell CA right leg  SH: Widow. Moved from [place] to [place] and living with her son.  Has 3 sons.  Former smoker (quit years ago).  Rare ETOH.   FH: Mother with CHF at 81, father with multiple myeloma.    Current Outpatient Prescriptions  Medication Sig Dispense Refill  . bisoprolol (ZEBETA) 10 MG tablet Take 1 tablet (10 mg total) by mouth daily.  30 tablet  6  . Calcium Carbonate-Vitamin D (CALCIUM 500 + D PO) Take by mouth daily.      . cloNIDine (CATAPRES) 0.2 MG tablet Take 1 tablet (0.2 mg total) by mouth 2 (two) times daily.  60 tablet  6  . Coenzyme Q10 (COQ10 PO) Take 400 mg by mouth.      . furosemide (LASIX) 40 MG tablet Alternate every other day -40mg  in the morning and 20mg  in the afternoon with 40mg  in the morning the next day. This will be one 40mg  tablet in the morning and one-half 40mg  tablet in the afternoon with one 40mg  tablet in the morning the next day  50 tablet  6  . furosemide (LASIX) 40 MG tablet       .  isosorbide mononitrate (IMDUR) 30 MG 24 hr tablet Take 1 tablet (30 mg total) by mouth daily.  30 tablet  6  . KLOR-CON M20 20 MEQ tablet TAKE 1 TABLET TWICE A DAY  30 tablet  6  . levothyroxine (SYNTHROID, LEVOTHROID) 100 MCG tablet Take 1 tablet (100 mcg total) by mouth daily. 1/2 tab daily  30 tablet  3  . lisinopril (PRINIVIL,ZESTRIL) 20 MG tablet TAKE 1 TABLET BY MOUTH TWICE DAILY  180 tablet  0  . lovastatin (MEVACOR) 20 MG tablet Take 20 mg by mouth at bedtime.      . Multiple Vitamins-Minerals (CENTRUM SILVER PO) Take by mouth daily.      . SF 5000 PLUS 1.1 % CREA dental cream Daily.      Marland Kitchen warfarin (COUMADIN) 1 MG tablet TAKE 1 TABLET BY MOUTH AS DIRECTED  30 tablet  2  . warfarin (COUMADIN) 1 MG tablet Take as directed by anticoagulation  clinic  50 tablet  3    BP 120/50  Pulse 55  Ht 4\' 10"  (1.473 m)  Wt 87 lb (39.463 kg)  BMI 18.18 kg/m2 General: frail, NAD Neck: JVP 8 cm, no thyromegaly or thyroid nodule.  Lungs: Bilateral basilar crackles CV: Nondisplaced PMI.  Heart irregular S1/S2, no S3/S4, 3/6 HSM LLSB/apex.  1+ left ankle edema, right leg wrapped.  No carotid bruit.  Normal pedal pulses.  Abdomen: Soft, nontender, no hepatosplenomegaly, no distention.  Skin: Unable to assess ulcerations, she is wearing compression stockings.    Neurologic: Alert and oriented x 3.  Psych: Normal affect. Extremities: No clubbing or cyanosis.   Assessment/Plan:  Atrial fibrillation  Chronic atrial fibrillation. Good rate control. Continue warfarin. She is tolerating bisoprolol with good rate control.   CAD (coronary artery disease)  Stable with no chest pain. Continue warfarin (no indication to add ASA), statin, beta blocker, ACEI.  Diastolic CHF, chronic  Stable NYHA class II-III symptoms. Weight is down. She is not markedly volume overloaded. She will continue current diuretic dosing.  She had a BMET done recently at [facility] office, I will call to get result faxed over.  Hyperlipidemia  LDL at goal (< 70) when last checked.  Will get recent lipids from PCP.  Pulmonary hypertension  Probably secondary to diastolic LV failure with elevated LA pressure and reactive pulmonary artery changes.  Valvular heart disease  Moderate to severe MR and TR, likely significant contributors to diastolic CHF and pulmonary HTN. Not an operative candidate.  Hypertension Patient wants to back off on some of her medications.  Since BP has been normal to low, I will have her decrease clonidine to 0.1 mg bid. If BP starts running > 140 systolic, she will increase back to 0.2 mg bid.   Janice Petersen 01/04/2012

## 2012-01-24 ENCOUNTER — Telehealth: Payer: Self-pay | Admitting: Cardiology

## 2012-01-24 MED ORDER — AMOXICILLIN 500 MG PO CAPS: ORAL_CAPSULE | ORAL | Status: DC

## 2012-01-24 NOTE — Telephone Encounter (Signed)
Patient called to have Amoxicillin medication send to [facility] so she can take prior dental cleaning. Pt has a history valvular heart disease. Patient states has been taken amoxicillin prior dental work for some time. A prescription  for Amoxicillin 500 mg sent to  pt's pharmacy. Pt to take 4 tablets by mouth one hour prior dental work. Pt verbalized understanding.

## 2012-01-24 NOTE — Telephone Encounter (Signed)
New Problem:     Patient called in needing a prescription for amoxicillian called in to her pharmacy for a dental appointment she has on 01/30/12.  Please call back.

## 2012-01-30 ENCOUNTER — Other Ambulatory Visit: Payer: Self-pay | Admitting: Cardiology

## 2012-01-31 ENCOUNTER — Ambulatory Visit (INDEPENDENT_AMBULATORY_CARE_PROVIDER_SITE_OTHER): Payer: Medicare Other | Admitting: *Deleted

## 2012-01-31 DIAGNOSIS — I4891 Unspecified atrial fibrillation: Secondary | ICD-10-CM

## 2012-02-05 ENCOUNTER — Telehealth: Payer: Self-pay | Admitting: Cardiology

## 2012-02-05 NOTE — Telephone Encounter (Signed)
Spoke with pt's caregiver.  She has been on doxycycline since Saturday.  She went to the doctor today.  Given rx for albuterol and prednisone dose pack.  Changed appt to 11/29 to recheck INR.

## 2012-02-05 NOTE — Telephone Encounter (Signed)
New problem:     Seen at office today. Without any sign of bleeding. Patient on Doxy  & coumadin.

## 2012-02-08 ENCOUNTER — Ambulatory Visit (INDEPENDENT_AMBULATORY_CARE_PROVIDER_SITE_OTHER): Payer: Medicare Other

## 2012-02-08 DIAGNOSIS — I4891 Unspecified atrial fibrillation: Secondary | ICD-10-CM

## 2012-02-08 LAB — POCT INR: INR: >8

## 2012-02-12 ENCOUNTER — Ambulatory Visit (INDEPENDENT_AMBULATORY_CARE_PROVIDER_SITE_OTHER): Payer: Medicare Other

## 2012-02-12 DIAGNOSIS — I4891 Unspecified atrial fibrillation: Secondary | ICD-10-CM

## 2012-02-12 LAB — POCT INR: INR: 2.6

## 2012-02-13 ENCOUNTER — Other Ambulatory Visit: Payer: Self-pay | Admitting: Cardiology

## 2012-02-26 ENCOUNTER — Ambulatory Visit (INDEPENDENT_AMBULATORY_CARE_PROVIDER_SITE_OTHER): Payer: Medicare Other | Admitting: *Deleted

## 2012-02-26 DIAGNOSIS — I4891 Unspecified atrial fibrillation: Secondary | ICD-10-CM

## 2012-02-26 LAB — POCT INR: INR: 4.2

## 2012-03-07 ENCOUNTER — Ambulatory Visit (INDEPENDENT_AMBULATORY_CARE_PROVIDER_SITE_OTHER): Payer: Medicare Other | Admitting: *Deleted

## 2012-03-07 DIAGNOSIS — I4891 Unspecified atrial fibrillation: Secondary | ICD-10-CM

## 2012-03-07 LAB — POCT INR: INR: 3.4

## 2012-03-13 ENCOUNTER — Other Ambulatory Visit: Payer: Self-pay | Admitting: *Deleted

## 2012-03-13 MED ORDER — CLONIDINE HCL 0.2 MG PO TABS: ORAL_TABLET | ORAL | Status: DC

## 2012-03-19 ENCOUNTER — Ambulatory Visit (INDEPENDENT_AMBULATORY_CARE_PROVIDER_SITE_OTHER): Payer: Medicare Other | Admitting: *Deleted

## 2012-03-19 DIAGNOSIS — I4891 Unspecified atrial fibrillation: Secondary | ICD-10-CM

## 2012-04-04 ENCOUNTER — Ambulatory Visit (INDEPENDENT_AMBULATORY_CARE_PROVIDER_SITE_OTHER): Payer: Medicare Other

## 2012-04-04 DIAGNOSIS — I4891 Unspecified atrial fibrillation: Secondary | ICD-10-CM

## 2012-04-23 ENCOUNTER — Other Ambulatory Visit: Payer: Self-pay | Admitting: Cardiology

## 2012-04-30 ENCOUNTER — Ambulatory Visit (INDEPENDENT_AMBULATORY_CARE_PROVIDER_SITE_OTHER): Payer: Medicare Other | Admitting: *Deleted

## 2012-04-30 DIAGNOSIS — I4891 Unspecified atrial fibrillation: Secondary | ICD-10-CM

## 2012-05-04 ENCOUNTER — Other Ambulatory Visit: Payer: Self-pay | Admitting: Cardiology

## 2012-05-06 ENCOUNTER — Ambulatory Visit (INDEPENDENT_AMBULATORY_CARE_PROVIDER_SITE_OTHER): Payer: Medicare Other | Admitting: Cardiology

## 2012-05-06 ENCOUNTER — Encounter: Payer: Self-pay | Admitting: Cardiology

## 2012-05-06 VITALS — BP 100/50 | HR 68 | Ht <= 58 in | Wt 89.0 lb

## 2012-05-06 DIAGNOSIS — I4891 Unspecified atrial fibrillation: Secondary | ICD-10-CM

## 2012-05-06 DIAGNOSIS — I509 Heart failure, unspecified: Secondary | ICD-10-CM

## 2012-05-06 DIAGNOSIS — I2510 Atherosclerotic heart disease of native coronary artery without angina pectoris: Secondary | ICD-10-CM

## 2012-05-06 DIAGNOSIS — I10 Essential (primary) hypertension: Secondary | ICD-10-CM

## 2012-05-06 DIAGNOSIS — I2789 Other specified pulmonary heart diseases: Secondary | ICD-10-CM

## 2012-05-06 DIAGNOSIS — I5032 Chronic diastolic (congestive) heart failure: Secondary | ICD-10-CM

## 2012-05-06 DIAGNOSIS — I272 Pulmonary hypertension, unspecified: Secondary | ICD-10-CM

## 2012-05-06 DIAGNOSIS — I38 Endocarditis, valve unspecified: Secondary | ICD-10-CM

## 2012-05-06 DIAGNOSIS — R0602 Shortness of breath: Secondary | ICD-10-CM

## 2012-05-06 LAB — CBC WITH DIFFERENTIAL/PLATELET
Basophils Absolute: 0.1 10*3/uL (ref 0.0–0.1)
Eosinophils Relative: 2.3 % (ref 0.0–5.0)
HCT: 35.6 % — ABNORMAL LOW (ref 36.0–46.0)
Lymphs Abs: 0.8 10*3/uL (ref 0.7–4.0)
MCV: 92.2 fl (ref 78.0–100.0)
Monocytes Absolute: 0.7 10*3/uL (ref 0.1–1.0)
Neutro Abs: 5.2 10*3/uL (ref 1.4–7.7)
Platelets: 135 10*3/uL — ABNORMAL LOW (ref 150.0–400.0)
RDW: 17.1 % — ABNORMAL HIGH (ref 11.5–14.6)

## 2012-05-06 LAB — BASIC METABOLIC PANEL
Calcium: 9.1 mg/dL (ref 8.4–10.5)
Creatinine, Ser: 1.3 mg/dL — ABNORMAL HIGH (ref 0.4–1.2)
GFR: 40.09 mL/min — ABNORMAL LOW (ref >60.00)
Glucose, Bld: 114 mg/dL — ABNORMAL HIGH (ref 70–99)
Sodium: 138 mEq/L (ref 135–145)

## 2012-05-06 NOTE — Patient Instructions (Addendum)
Stop clonidine. If your blood pressure stays above 150 regularly you can restart clonidine 0.1mg  two times a day.  Your physician recommends that you return for lab work today--BMET/BNP/CBCd  Your physician recommends that you schedule a follow-up appointment in: 3 months with Dr Shirlee Latch.

## 2012-05-06 NOTE — Progress Notes (Signed)
Patient ID: Janice Petersen, female   DOB: [birth year], 79 y.o.   MRN: [id number] PCP: Janice Petersen  79 yo with history of diastolic CHF, valvular heart disease, CAD, and chronic atrial fibrillation presents for cardiology evaluation.  She used to live in [facility] and was followed by a cardiologist down there.  She has recently moved to [facility] to be nearer her family (currently living with one of her sons).  She had a stent placed in her RCA in 2004.  She has had a long history of valvular disease, last echo showed moderate to severe MR and moderate to severe TR with severe pulmonary hypertension.  She has a history of diastolic CHF.  She has been in atrial fibrillation since 2003 on warfarin.  Since she was last seen in this office, Janice Petersen has been stable.  She is taking Lasix 40 qam/20 qpm alternating with 40 daily.  Weight is stable.  She denies exertional dyspnea walking around her house and she can walk up a flight of steps without significant dyspnea.  Leg edema is down.  She has only been taking clonidine once a day.  BP is somewhat low today at 100/50.  No falls.    Labs (2/13): K 4.1, creatinine 1.1 => 1.4, proBNP 683, LDL 54, HDL 44 Labs (4/13): Creatinine 1.87, K 4.9, BNP 486 Labs (6/13): K 3.6, creatinine 1.4  PMH: 1. Diastolic CHF: Echo (10/12) with EF 65%, mild AI, moderate to severe MR, moderate to severe TR, PA systolic pressure 84 mmHg.  She has been on home oxygen since 1/13.  2. Chronic atrial fibrillation since 2003. 3. PMR 4. H/o appendectomy 5. TAH 6. Renal artery stenosis: 50% left renal artery stenosis found on angiogram in 8/02.  7. Hypothyroidism 8. MRSA sepsis in 2006 9. Hyperlipidemia 10. H/o PE 11. CAD: Cypher DES to proximal RCA in 2/04.  Last Lexiscan myoview in 2011 showed EF 67%, normal.  12. Valvular heart disease: Last echo in 10/12 showed moderate to severe MR, moderate to severe TR.  13. Pulmonary hypertension: Suspect secondary to elevated left atrial  pressure as well as probable reactive pulmonary vascular changes.  14. CKD 15. Squamous cell CA right leg  SH: Widow. Moved from [place] to [place] and living with her son.  Has 3 sons.  Former smoker (quit years ago).  Rare ETOH.   FH: Mother with CHF at 57, father with multiple myeloma.    Current Outpatient Prescriptions  Medication Sig Dispense Refill  . bisoprolol (ZEBETA) 10 MG tablet TAKE 1 TABLET (10 MG TOTAL) BY MOUTH DAILY.  30 tablet  6  . Calcium Carbonate-Vitamin D (CALCIUM 500 + D PO) Take by mouth daily.      . cloNIDine (CATAPRES) 0.2 MG tablet Take 0.1 mg by mouth daily. If BP is higher than 140 take one whole tablet daily      . Coenzyme Q10 (COQ10 PO) Take 400 mg by mouth.      . furosemide (LASIX) 40 MG tablet Alternate every other day -40mg  in the morning and 20mg  in the afternoon with 40mg  in the morning the next day. This will be one 40mg  tablet in the morning and one-half 40mg  tablet in the afternoon with one 40mg  tablet in the morning the next day  50 tablet  6  . isosorbide mononitrate (IMDUR) 30 MG 24 hr tablet Take 1 tablet (30 mg total) by mouth daily.  30 tablet  6  . KLOR-CON M20 20 MEQ tablet  TAKE 1 TABLET TWICE A DAY  30 tablet  6  . levothyroxine (SYNTHROID, LEVOTHROID) 100 MCG tablet Take 1 tablet (100 mcg total) by mouth daily. 1/2 tab daily  30 tablet  3  . lisinopril (PRINIVIL,ZESTRIL) 20 MG tablet TAKE 1 TABLET BY MOUTH TWICE DAILY  180 tablet  0  . lovastatin (MEVACOR) 20 MG tablet Take 20 mg by mouth at bedtime.      . Multiple Vitamins-Minerals (CENTRUM SILVER PO) Take by mouth daily.      Marland Kitchen warfarin (COUMADIN) 1 MG tablet TAKE 1 TABLET BY MOUTH AS DIRECTED  30 tablet  2  . warfarin (COUMADIN) 1 MG tablet TAKE AS DIRECTED BY ANTICOAGULATION CLINIC  50 tablet  3  . amoxicillin (AMOXIL) 500 MG capsule Take 4 tablets by mouth one hour prior dental work  4 capsule  0  . SF 5000 PLUS 1.1 % CREA dental cream Daily.       No current  facility-administered medications for this visit.    BP 100/50  Pulse 68  Ht 4\' 10"  (1.473 m)  Wt 89 lb (40.37 kg)  BMI 18.61 kg/m2 General: frail, NAD Neck: JVP 7 cm, no thyromegaly or thyroid nodule.  Lungs: Slight bilateral basilar crackles CV: Nondisplaced PMI.  Heart irregular S1/S2, no S3/S4, 2/6 HSM LLSB/apex.  Trace bilateral ankle edema  No carotid bruit.   Abdomen: Soft, nontender, no hepatosplenomegaly, no distention.     Neurologic: Alert and oriented x 3.  Psych: Normal affect. Extremities: No clubbing or cyanosis.   Assessment/Plan:  Atrial fibrillation  Chronic atrial fibrillation.  Continue warfarin. She is tolerating bisoprolol with good rate control.  Check CBC today.    CAD (coronary artery disease)  Stable with no chest pain. Continue warfarin (no indication to add ASA), statin, beta blocker, ACEI.  Diastolic CHF, chronic  Stable NYHA class II-III symptoms. Weight is stable and she is not significantly volume overloaded. She will continue current diuretic dosing.  I will check BMET and BNP today.  Hyperlipidemia  I will call PCP for most recent lipids.   Pulmonary hypertension  Probably secondary to diastolic LV failure with elevated LA pressure and reactive pulmonary artery changes.  Valvular heart disease  Moderate to severe MR and TR, likely significant contributors to diastolic CHF and pulmonary HTN. Not an operative candidate.  Hypertension She is only taking 0.1 mg clonidine once a day and her BP is 100/50 today.  I will have her stop clonidine altogether and she will check her BP on her home cuff.  She will record the numbers and get them to [name].     Janice Petersen 05/06/2012

## 2012-06-07 ENCOUNTER — Other Ambulatory Visit: Payer: Self-pay | Admitting: Cardiology

## 2012-06-10 ENCOUNTER — Ambulatory Visit (INDEPENDENT_AMBULATORY_CARE_PROVIDER_SITE_OTHER): Payer: Medicare Other | Admitting: *Deleted

## 2012-06-10 DIAGNOSIS — I4891 Unspecified atrial fibrillation: Secondary | ICD-10-CM

## 2012-06-17 ENCOUNTER — Telehealth: Payer: Self-pay | Admitting: Cardiology

## 2012-06-17 MED ORDER — AMOXICILLIN 500 MG PO CAPS: ORAL_CAPSULE | ORAL | Status: DC

## 2012-06-17 NOTE — Telephone Encounter (Signed)
I spoke with pt and she prefers to continue taking amoxicillin before dental appts.

## 2012-06-17 NOTE — Telephone Encounter (Signed)
New Prob   Pt having dental work next week and is requesting some amoxicillin. Would like to speak to nurse.

## 2012-06-17 NOTE — Telephone Encounter (Signed)
By current recommendations she really does not have to but if she feels safer taking amoxicillin prior to the dental work that is fine to do.

## 2012-06-17 NOTE — Telephone Encounter (Signed)
Pt states this was initially recommended by her cardiologist in [facility] and she has been doing this for years.

## 2012-06-20 ENCOUNTER — Other Ambulatory Visit: Payer: Self-pay | Admitting: *Deleted

## 2012-06-20 ENCOUNTER — Other Ambulatory Visit: Payer: Self-pay | Admitting: Cardiology

## 2012-06-30 ENCOUNTER — Other Ambulatory Visit: Payer: Self-pay | Admitting: Cardiology

## 2012-07-22 ENCOUNTER — Ambulatory Visit (INDEPENDENT_AMBULATORY_CARE_PROVIDER_SITE_OTHER): Payer: Medicare Other | Admitting: *Deleted

## 2012-07-22 DIAGNOSIS — I4891 Unspecified atrial fibrillation: Secondary | ICD-10-CM

## 2012-08-06 ENCOUNTER — Ambulatory Visit (INDEPENDENT_AMBULATORY_CARE_PROVIDER_SITE_OTHER): Payer: Medicare Other | Admitting: Cardiology

## 2012-08-06 ENCOUNTER — Encounter: Payer: Self-pay | Admitting: Cardiology

## 2012-08-06 VITALS — BP 110/52 | HR 64 | Ht <= 58 in | Wt 93.0 lb

## 2012-08-06 DIAGNOSIS — R0602 Shortness of breath: Secondary | ICD-10-CM

## 2012-08-06 DIAGNOSIS — I5032 Chronic diastolic (congestive) heart failure: Secondary | ICD-10-CM

## 2012-08-06 DIAGNOSIS — I10 Essential (primary) hypertension: Secondary | ICD-10-CM

## 2012-08-06 DIAGNOSIS — I509 Heart failure, unspecified: Secondary | ICD-10-CM

## 2012-08-06 DIAGNOSIS — E785 Hyperlipidemia, unspecified: Secondary | ICD-10-CM

## 2012-08-06 DIAGNOSIS — I272 Pulmonary hypertension, unspecified: Secondary | ICD-10-CM

## 2012-08-06 DIAGNOSIS — I4891 Unspecified atrial fibrillation: Secondary | ICD-10-CM

## 2012-08-06 DIAGNOSIS — I2789 Other specified pulmonary heart diseases: Secondary | ICD-10-CM

## 2012-08-06 DIAGNOSIS — I2510 Atherosclerotic heart disease of native coronary artery without angina pectoris: Secondary | ICD-10-CM

## 2012-08-06 MED ORDER — FUROSEMIDE 20 MG PO TABS: ORAL_TABLET | ORAL | Status: DC

## 2012-08-06 NOTE — Patient Instructions (Addendum)
New Lasix Dosing:  40mg  in the morning (2 tabs), and 20mg  in the afternoon (1 tab).  LABS IN 2 WEEKS:  bmet & bnp.  Your physician recommends that you schedule a follow-up appointment in: 2 months with Dr. Shirlee Latch.

## 2012-08-06 NOTE — Progress Notes (Signed)
Patient ID: Janice Petersen, female   DOB: [birth year], 79 y.o.   MRN: [id number] PCP: Paulino Rily  79 y.o. with history of diastolic CHF, valvular heart disease, CAD, and chronic atrial fibrillation presents for cardiology evaluation.  She used to live in [facility] and was followed by a cardiologist down there.  She has recently moved to [facility] to be nearer her family (currently living with one of her sons).  She had a stent placed in her RCA in 2004.  She has had a long history of valvular disease, last echo showed moderate to severe MR and moderate to severe TR with severe pulmonary hypertension.  She has a history of diastolic CHF.  She has been in atrial fibrillation since 2003 on warfarin.  Since last appointment, patient reports increased exertional dyspnea.  Her weight is up 4 lbs.  She notes more dyspnea walking up steps and with activities like dressing and housework.  No chest pain.  No orthopnea.   Labs (2/13): K 4.1, creatinine 1.1 => 1.4, proBNP 683, LDL 54, HDL 44 Labs (4/13): Creatinine 1.87, K 4.9, BNP 486 Labs (6/13): K 3.6, creatinine 1.4 Labs (2/14): K 3.8, creatinine 1.3, BNP 446  PMH: 1. Diastolic CHF: Echo (10/12) with EF 65%, mild AI, moderate to severe MR, moderate to severe TR, PA systolic pressure 84 mmHg.  She has been on home oxygen since 1/13.  2. Chronic atrial fibrillation since 2003. 3. PMR 4. H/o appendectomy 5. TAH 6. Renal artery stenosis: 50% left renal artery stenosis found on angiogram in 8/02.  7. Hypothyroidism 8. MRSA sepsis in 2006 9. Hyperlipidemia 10. H/o PE 11. CAD: Cypher DES to proximal RCA in 2/04.  Last Lexiscan myoview in 2011 showed EF 67%, normal.  12. Valvular heart disease: Last echo in 10/12 showed moderate to severe MR, moderate to severe TR.  13. Pulmonary hypertension: Suspect secondary to elevated left atrial pressure as well as probable reactive pulmonary vascular changes.  14. CKD 15. Squamous cell CA right leg  SH: Widow. Moved from  [place] to [place] and living with her son.  Has 3 sons.  Former smoker (quit years ago).  Rare ETOH.   FH: Mother with CHF at 31, father with multiple myeloma.   ROS: All systems reviewed and negative except as per HPI.   Current Outpatient Prescriptions  Medication Sig Dispense Refill  . amoxicillin (AMOXIL) 500 MG capsule Take 4 tablets by mouth one hour prior dental work  8 capsule  0  . bisoprolol (ZEBETA) 10 MG tablet TAKE 1 TABLET (10 MG TOTAL) BY MOUTH DAILY.  90 tablet  3  . Calcium Carbonate-Vitamin D (CALCIUM 500 + D PO) Take by mouth daily.      . Coenzyme Q10 (COQ10 PO) Take 400 mg by mouth.      . isosorbide mononitrate (IMDUR) 30 MG 24 hr tablet Take 1 tablet (30 mg total) by mouth daily.  30 tablet  6  . levothyroxine (SYNTHROID, LEVOTHROID) 100 MCG tablet Take 1 tablet (100 mcg total) by mouth daily. 1/2 tab daily  30 tablet  3  . lisinopril (PRINIVIL,ZESTRIL) 20 MG tablet TAKE 1 TABLET BY MOUTH TWICE DAILY  180 tablet  1  . lovastatin (MEVACOR) 20 MG tablet Take 20 mg by mouth at bedtime.      . Multiple Vitamins-Minerals (CENTRUM SILVER PO) Take by mouth daily.      . SF 5000 PLUS 1.1 % CREA dental cream Daily.      Marland Kitchen  warfarin (COUMADIN) 1 MG tablet TAKE AS DIRECTED BY ANTICOAGULATION CLINIC  50 tablet  3  . furosemide (LASIX) 20 MG tablet Take 2 tabs in the morning and 1 in the afternoon.  270 tablet  3   No current facility-administered medications for this visit.    BP 110/52  Pulse 64  Ht 4\' 10"  (1.473 m)  Wt 93 lb (42.185 kg)  BMI 19.44 kg/m2 General: frail, NAD Neck: JVP 9-10 cm, no thyromegaly or thyroid nodule.  Lungs: Slight bilateral basilar crackles CV: Nondisplaced PMI.  Heart irregular S1/S2, no S3/S4, 2/6 HSM LLSB/apex.  1+ left ankle edema  No carotid bruit.   Abdomen: Soft, nontender, no hepatosplenomegaly, no distention.     Neurologic: Alert and oriented x 3.  Psych: Normal affect. Extremities: No clubbing or cyanosis.    Assessment/Plan:  Atrial fibrillation  Chronic atrial fibrillation.  Continue warfarin. She is tolerating bisoprolol with good rate control.   CAD  Stable with no chest pain. Continue warfarin (no indication to add ASA), statin, beta blocker, ACEI.  Diastolic CHF, chronic  Weight is up and she is a bit more symptomatic today, NYHA class III.  I am going to increase Lasix to 40 qam, 20 qpm every day.  BMET/BNP in 2 wks.    Pulmonary hypertension  Probably secondary to diastolic LV failure with elevated LA pressure and reactive pulmonary artery changes.  Valvular heart disease  Moderate to severe MR and TR, likely significant contributors to diastolic CHF and pulmonary HTN. Not an operative candidate.  Hypertension BP is well-controlled.      [name] 08/06/2012

## 2012-08-08 ENCOUNTER — Ambulatory Visit: Payer: Medicare Other | Admitting: Audiology

## 2012-08-18 ENCOUNTER — Other Ambulatory Visit (INDEPENDENT_AMBULATORY_CARE_PROVIDER_SITE_OTHER): Payer: Medicare Other

## 2012-08-18 DIAGNOSIS — R0602 Shortness of breath: Secondary | ICD-10-CM

## 2012-08-18 DIAGNOSIS — I5032 Chronic diastolic (congestive) heart failure: Secondary | ICD-10-CM

## 2012-08-18 DIAGNOSIS — I509 Heart failure, unspecified: Secondary | ICD-10-CM

## 2012-08-18 LAB — BASIC METABOLIC PANEL
BUN: 36 mg/dL — ABNORMAL HIGH (ref 6–23)
CO2: 32 mEq/L (ref 19–32)
Chloride: 101 mEq/L (ref 96–112)
Creatinine, Ser: 1.4 mg/dL — ABNORMAL HIGH (ref 0.4–1.2)
Glucose, Bld: 80 mg/dL (ref 70–99)

## 2012-08-18 LAB — BRAIN NATRIURETIC PEPTIDE: Pro B Natriuretic peptide (BNP): 733 pg/mL — ABNORMAL HIGH (ref 0.0–100.0)

## 2012-08-19 ENCOUNTER — Ambulatory Visit: Payer: Medicare Other | Attending: Audiology | Admitting: Audiology

## 2012-08-19 DIAGNOSIS — H903 Sensorineural hearing loss, bilateral: Secondary | ICD-10-CM | POA: Insufficient documentation

## 2012-08-19 DIAGNOSIS — H9072 Mixed conductive and sensorineural hearing loss, unilateral, left ear, with unrestricted hearing on the contralateral side: Secondary | ICD-10-CM

## 2012-08-19 NOTE — Procedures (Signed)
   [facility] [address] [place], [place]  [address] [phone]  AUDIOLOGICAL EVALUATION  Janice Petersen  [id number] [birth year]      HISTORY:  Janice Petersen, a delightful 79 y.o., old was seen for audiological evaluation upon referral of WOLTERS,SHARON A, MD.   The patient reported a gradual decrease of hearing on the left side.  There was no report of  ear infections,  tinnitus, facial numbness or dizziness.  There is a positive familial history of hearing loss as her mother did have trouble with her hearing in her 3's due to a mastoidectomy.  Janice Petersen stated some difficulty with understanding conversational speech in quiet and with the television.    REPORT OF PAIN:  None  EVALUATION:   Air and bone conduction audiometry from 500Hz  - 8000Hz  utilizing standard  earphones revealed a mild to severe sensory neural hearing loss on the right side and a moderate to profound mixed loss on the left side.   Speech reception thresholds were consistent with the pure tone results indicative of good test reliability.  Speech recognition testing was conducted in each ear independently, at a comfortable listening level and she obtained scores of 92% and 88% in the right and left ears respectively.  Impedance audiometry was utilized and a normal Type A tympanogram was obtained on the right side indicative of good middle ear function. A flat type B tympanogram was observed on the left side consistent with middle ear effusion.   Acoustic reflexes were screened at 1000Hz  utilizing ipsilateral stimulation and were absent bilaterally.  CONCLUSION:   Janice Petersen appears to have a mild to moderate sensory neural loss on the right side and a moderate to profound mixed loss on the left side with middle ear fluid.  Speech recognition abilities are normal or only mildly reduced at a comfortable listening level.  RECOMMENDATIONS:    1. Evaluation by an ear, nose and  throat specialist is recommended for the treatment of the middle ear fluid on the left side. 2. After treatment is complete, she may or may not want to consider amplification.  She was given suggestion on how to best enhance her listening environment to her advantage.    Allyn Kenner [name], Au.D. Doctor of Audiology CCC-A  cc: Emeterio Reeve, MD

## 2012-08-19 NOTE — Patient Instructions (Signed)
Your hearing test today indicates that you have fluid behind your left eardrum.  You presently have a mild to sever sensory neural loss in your right ear and a moderate to profound mixed loss in your left ear  (mixed, because of the fluid). You need to be evaluated by an ear, nose and throat specialist for treatment of the fluid.  You may or may not wish to pursue amplification after treatment

## 2012-08-22 ENCOUNTER — Ambulatory Visit: Payer: Medicare Other | Admitting: Cardiology

## 2012-09-02 ENCOUNTER — Ambulatory Visit (INDEPENDENT_AMBULATORY_CARE_PROVIDER_SITE_OTHER): Payer: Medicare Other | Admitting: *Deleted

## 2012-09-02 DIAGNOSIS — I4891 Unspecified atrial fibrillation: Secondary | ICD-10-CM

## 2012-09-02 LAB — POCT INR: INR: 2.8

## 2012-10-13 ENCOUNTER — Ambulatory Visit (INDEPENDENT_AMBULATORY_CARE_PROVIDER_SITE_OTHER): Payer: Medicare Other | Admitting: General Practice

## 2012-10-13 DIAGNOSIS — I4891 Unspecified atrial fibrillation: Secondary | ICD-10-CM

## 2012-10-13 DIAGNOSIS — Z7901 Long term (current) use of anticoagulants: Secondary | ICD-10-CM

## 2012-10-24 ENCOUNTER — Other Ambulatory Visit: Payer: Self-pay | Admitting: Cardiology

## 2012-10-28 ENCOUNTER — Ambulatory Visit: Payer: Medicare Other

## 2012-10-29 ENCOUNTER — Encounter: Payer: Self-pay | Admitting: Cardiology

## 2012-10-29 ENCOUNTER — Ambulatory Visit (INDEPENDENT_AMBULATORY_CARE_PROVIDER_SITE_OTHER): Payer: Medicare Other | Admitting: Cardiology

## 2012-10-29 ENCOUNTER — Ambulatory Visit (INDEPENDENT_AMBULATORY_CARE_PROVIDER_SITE_OTHER): Payer: Medicare Other | Admitting: *Deleted

## 2012-10-29 VITALS — BP 110/62 | HR 64 | Wt 94.0 lb

## 2012-10-29 DIAGNOSIS — I4891 Unspecified atrial fibrillation: Secondary | ICD-10-CM

## 2012-10-29 DIAGNOSIS — I509 Heart failure, unspecified: Secondary | ICD-10-CM

## 2012-10-29 DIAGNOSIS — I38 Endocarditis, valve unspecified: Secondary | ICD-10-CM

## 2012-10-29 DIAGNOSIS — I2510 Atherosclerotic heart disease of native coronary artery without angina pectoris: Secondary | ICD-10-CM

## 2012-10-29 DIAGNOSIS — I5032 Chronic diastolic (congestive) heart failure: Secondary | ICD-10-CM

## 2012-10-29 DIAGNOSIS — R0602 Shortness of breath: Secondary | ICD-10-CM

## 2012-10-29 LAB — POCT INR: INR: 3.5

## 2012-10-29 MED ORDER — FUROSEMIDE 40 MG PO TABS: ORAL_TABLET | ORAL | Status: DC

## 2012-10-29 NOTE — Patient Instructions (Addendum)
Increase lasix (furosemide) to 80mg  in the AM and 40mg  in the PM for 3 days, then take 40mg  two times a day. This will be 2 of your 40mg  tablets in the AM and 1 of your 40mg  tablets in the PM for 3 days, then 1 of your 40mg  tablets two times a day.   Your physician recommends that you return for a FASTING lipid profile/BMET/BNP in 1 week.   Your physician recommends that you schedule a follow-up appointment in: 2 weeks with the PA/NP.    Your physician recommends that you schedule a follow-up appointment in: 6-7 weeks with Dr Shirlee Latch.

## 2012-10-30 NOTE — Progress Notes (Signed)
Patient ID: Janice Petersen, female   DOB: September 11, 1915, 79 y.o.   MRN: [id number] PCP: Paulino Rily  79 yo with history of diastolic CHF, valvular heart disease, CAD, and chronic atrial fibrillation presents for cardiology evaluation.  She used to live in [facility] and was followed by a cardiologist down there.  She has moved to [facility] to be nearer her family (currently living with one of her sons).  She had a stent placed in her RCA in 2004.  She has had a long history of valvular disease, last echo showed moderate to severe MR and moderate to severe TR with severe pulmonary hypertension.  She has a history of diastolic CHF.  She has been in atrial fibrillation since 2003 on warfarin.  Patient continues to be more short of breath than her baseline.  Weight is up another lb.  Occasional wheezing.  She notes more dyspnea walking up steps and with activities like dressing and housework. No chest pain.  No orthopnea. No lightheadedness or syncope.   Labs (2/13): K 4.1, creatinine 1.1 => 1.4, proBNP 683, LDL 54, HDL 44 Labs (4/13): Creatinine 1.87, K 4.9, BNP 486 Labs (6/13): K 3.6, creatinine 1.4 Labs (2/14): K 3.8, creatinine 1.3, BNP 446 Labs (6/14): K 3.6, creatinine 1.4, BNP 733  ECG: Atrial fibrillation with rate 64, nonspecific T wave changes  PMH: 1. Diastolic CHF: Echo (10/12) with EF 65%, mild AI, moderate to severe MR, moderate to severe TR, PA systolic pressure 84 mmHg.  She has been on home oxygen since 1/13.  2. Chronic atrial fibrillation since 2003. 3. PMR 4. H/o appendectomy 5. TAH 6. Renal artery stenosis: 50% left renal artery stenosis found on angiogram in 8/02.  7. Hypothyroidism 8. MRSA sepsis in 2006 9. Hyperlipidemia 10. H/o PE 11. CAD: Cypher DES to proximal RCA in 2/04.  Last Lexiscan myoview in 2011 showed EF 67%, normal.  12. Valvular heart disease: Last echo in 10/12 showed moderate to severe MR, moderate to severe TR.  13. Pulmonary hypertension: Suspect secondary to  elevated left atrial pressure as well as probable reactive pulmonary vascular changes.  14. CKD 15. Squamous cell CA right leg  SH: Widow. Moved from [place] to [place] and living with her son.  Has 3 sons.  Former smoker (quit years ago).  Rare ETOH.   FH: Mother with CHF at 37, father with multiple myeloma.   ROS: All systems reviewed and negative except as per HPI.   Current Outpatient Prescriptions  Medication Sig Dispense Refill  . bisoprolol (ZEBETA) 10 MG tablet TAKE 1 TABLET (10 MG TOTAL) BY MOUTH DAILY.  90 tablet  3  . Calcium Carbonate-Vitamin D (CALCIUM 500 + D PO) Take by mouth daily.      . Coenzyme Q10 (COQ10 PO) Take 400 mg by mouth.      . isosorbide mononitrate (IMDUR) 30 MG 24 hr tablet Take 1 tablet (30 mg total) by mouth daily.  30 tablet  6  . levothyroxine (SYNTHROID, LEVOTHROID) 100 MCG tablet Take 1 tablet (100 mcg total) by mouth daily. 1/2 tab daily  30 tablet  3  . lisinopril (PRINIVIL,ZESTRIL) 20 MG tablet TAKE 1 TABLET BY MOUTH TWICE DAILY  180 tablet  1  . lovastatin (MEVACOR) 20 MG tablet Take 20 mg by mouth at bedtime.      . SF 5000 PLUS 1.1 % CREA dental cream Daily.      Marland Kitchen warfarin (COUMADIN) 1 MG tablet TAKE AS DIRECTED BY ANTICOAGULATION  CLINIC  50 tablet  3  . amoxicillin (AMOXIL) 500 MG capsule Take 4 tablets by mouth one hour prior dental work  8 capsule  0  . furosemide (LASIX) 40 MG tablet 2 tablets (total 80 mg) in the AM and 1 tablet (total 40mg ) in the PM for 3 days, then 1 tablet (40mg )  two times a day  66 tablet  6   No current facility-administered medications for this visit.    BP 110/62  Pulse 64  Wt 42.638 kg (94 lb)  BMI 19.65 kg/m2  SpO2 90% General: frail, NAD Neck: JVP 12-13 cm, no thyromegaly or thyroid nodule.  Lungs: Slight bilateral basilar crackles CV: Nondisplaced PMI.  Heart irregular S1/S2, no S3/S4, 2/6 HSM LLSB/apex.  1+ left leg edema 1/2 to knee, 1+ right ankle edema,  No carotid bruit.   Abdomen: Soft,  nontender, no hepatosplenomegaly, no distention.     Neurologic: Alert and oriented x 3.  Psych: Normal affect. Extremities: No clubbing or cyanosis.   Assessment/Plan:  Atrial fibrillation  Chronic atrial fibrillation.  Continue warfarin. She is tolerating bisoprolol with good rate control.   CAD  Stable with no chest pain. Continue warfarin (no indication to add ASA), statin, beta blocker, ACEI.  Diastolic CHF, chronic  Weight is up and she is a bit more symptomatic today, NYHA class III.  I will increase Lasix to 80 qam/40 qpm x 3 days, then 40 mg bid after that.  BMET/BNP in 1 week. Pulmonary hypertension  Probably secondary to diastolic LV failure with elevated LA pressure and reactive pulmonary artery changes.  Valvular heart disease  Moderate to severe MR and TR, likely significant contributors to diastolic CHF and pulmonary HTN. Not an operative candidate.  Hypertension BP is well-controlled.     Followup 4-6 wks unless she gets worse.    [name] 10/30/2012

## 2012-11-05 ENCOUNTER — Other Ambulatory Visit (INDEPENDENT_AMBULATORY_CARE_PROVIDER_SITE_OTHER): Payer: Medicare Other

## 2012-11-05 DIAGNOSIS — I2510 Atherosclerotic heart disease of native coronary artery without angina pectoris: Secondary | ICD-10-CM

## 2012-11-05 DIAGNOSIS — R0602 Shortness of breath: Secondary | ICD-10-CM

## 2012-11-05 DIAGNOSIS — I4891 Unspecified atrial fibrillation: Secondary | ICD-10-CM

## 2012-11-06 LAB — LIPID PANEL
Cholesterol: 112 mg/dL (ref 0–200)
LDL Cholesterol: 49 mg/dL (ref 0–99)
Triglycerides: 59 mg/dL (ref 0.0–149.0)

## 2012-11-06 LAB — BASIC METABOLIC PANEL
CO2: 29 mEq/L (ref 19–32)
GFR: 41.14 mL/min — ABNORMAL LOW (ref >60.00)
Glucose, Bld: 76 mg/dL (ref 70–99)
Potassium: 3.8 mEq/L (ref 3.5–5.1)
Sodium: 137 mEq/L (ref 135–145)

## 2012-11-19 ENCOUNTER — Ambulatory Visit (INDEPENDENT_AMBULATORY_CARE_PROVIDER_SITE_OTHER): Payer: Medicare Other | Admitting: *Deleted

## 2012-11-19 ENCOUNTER — Encounter: Payer: Self-pay | Admitting: Physician Assistant

## 2012-11-19 ENCOUNTER — Ambulatory Visit (INDEPENDENT_AMBULATORY_CARE_PROVIDER_SITE_OTHER): Payer: Medicare Other | Admitting: Physician Assistant

## 2012-11-19 VITALS — BP 114/63 | HR 60 | Ht <= 58 in | Wt 92.0 lb

## 2012-11-19 DIAGNOSIS — I4891 Unspecified atrial fibrillation: Secondary | ICD-10-CM

## 2012-11-19 DIAGNOSIS — I38 Endocarditis, valve unspecified: Secondary | ICD-10-CM

## 2012-11-19 DIAGNOSIS — I509 Heart failure, unspecified: Secondary | ICD-10-CM

## 2012-11-19 DIAGNOSIS — I5032 Chronic diastolic (congestive) heart failure: Secondary | ICD-10-CM

## 2012-11-19 DIAGNOSIS — I2510 Atherosclerotic heart disease of native coronary artery without angina pectoris: Secondary | ICD-10-CM

## 2012-11-19 DIAGNOSIS — I10 Essential (primary) hypertension: Secondary | ICD-10-CM

## 2012-11-19 NOTE — Progress Notes (Signed)
[address] [place], [place]  [address] Phone: [phone] Fax:  [phone]  Date:  11/19/2012   ID:  Janice Petersen, DOB 1915/10/08, MRN [id number]  PCP:  Emeterio Reeve, MD  Cardiologist:  Dr. [name]     History of Present Illness: Janice Petersen is a 79 y.o. female who returns for follow up.  She has a hx of diastolic CHF, valvular heart disease, CAD, and chronic atrial fibrillation.  She used to live in [facility] and was followed by a cardiologist there. She recently moved to [facility] to be nearer her family (currently living with one of her sons). She had a stent placed in her RCA in 2004. She has had a long history of valvular disease, last echo showed moderate to severe MR and moderate to severe TR with severe pulmonary hypertension. She has a history of diastolic CHF. She has been in atrial fibrillation since 2003 on warfarin.   She was last seen by Dr. [name] 10/29/12.  She was felt to be volume overloaded. Her Lasix was adjusted. She was brought back today for close follow up.  Her breathing is improved.  She is chronically NYHA class III-IIIb.  Sleeps on 2 pillows.  LE edema is improved.  No CP.  No syncope.    Labs (2/13): K 4.1, creatinine 1.1 => 1.4, proBNP 683, LDL 54, HDL 44  Labs (4/13): Creatinine 1.87, K 4.9, BNP 486  Labs (6/13): K 3.6, creatinine 1.4  Labs (2/14): K 3.8, creatinine 1.3, BNP 446  Labs (6/14): K 3.6, creatinine 1.4, BNP 733  Labs (8/14): K 3.8, Cr 1.3, proBNP 721, HDL 51.7, LDL 49,    Wt Readings from Last 3 Encounters:  11/19/12 92 lb (41.731 kg)  10/29/12 94 lb (42.638 kg)  08/06/12 93 lb (42.185 kg)     Past Medical History  Diagnosis Date  . Hypertension   . PMR (polymyalgia rheumatica)   . Valvular heart disease     a. last echo 10/12 with mod to severe MR, mod to severe TR  . Renal artery stenosis     a. L RA 50% by angio 8/02  . Atrial fibrillation     permanent since 2003  . CAD (coronary artery  disease)     a. s/p Cypher DES to [facility] 04/2002;  b. Lex Myoview 2011:  EF 67%, normal   . Hyperlipidemia   . Staphylococcal sepsis     MRSA sepsis 2006  . Chronic diastolic heart failure     a. Echo (10/12):  EF 65%, mild AI, mod to severe MR, mod to severe TR, PASP 84  -  on home O2 since 03/2011  . Hypothyroidism   . History of pulmonary embolism   . Pulmonary hypertension     suspect secondary to elevated LA pressure as well as porbable reactive pulmonary vascular changes  . CKD (chronic kidney disease)   . Squamous cell skin cancer     right leg    Past Surgical History  Procedure Laterality Date  . Appendectomy  1942  . Other surgical history  1957    HYSTERECTOMY     Current Outpatient Prescriptions  Medication Sig Dispense Refill  . amoxicillin (AMOXIL) 500 MG capsule Take 4 tablets by mouth one hour prior dental work  8 capsule  0  . bisoprolol (ZEBETA) 10 MG tablet TAKE 1 TABLET (10 MG TOTAL) BY MOUTH DAILY.  90 tablet  3  . Calcium  Carbonate-Vitamin D (CALCIUM 500 + D PO) Take by mouth daily.      . Coenzyme Q10 (COQ10 PO) Take 400 mg by mouth.      . furosemide (LASIX) 40 MG tablet 2 tablets (total 80 mg) in the AM and 1 tablet (total 40mg ) in the PM for 3 days, then 1 tablet (40mg )  two times a day  66 tablet  6  . isosorbide mononitrate (IMDUR) 30 MG 24 hr tablet Take 1 tablet (30 mg total) by mouth daily.  30 tablet  6  . levothyroxine (SYNTHROID, LEVOTHROID) 100 MCG tablet Take 1 tablet (100 mcg total) by mouth daily. 1/2 tab daily  30 tablet  3  . lisinopril (PRINIVIL,ZESTRIL) 20 MG tablet TAKE 1 TABLET BY MOUTH TWICE DAILY  180 tablet  1  . lovastatin (MEVACOR) 20 MG tablet Take 20 mg by mouth at bedtime.      . SF 5000 PLUS 1.1 % CREA dental cream Daily.      Marland Kitchen warfarin (COUMADIN) 1 MG tablet TAKE AS DIRECTED BY ANTICOAGULATION CLINIC  50 tablet  3   No current facility-administered medications for this visit.    Allergies:    Allergies  Allergen Reactions    . Dyazide [Hydrochlorothiazide W-Triamterene] Shortness Of Breath  . Procardia [Nifedipine] Shortness Of Breath    DIFFICULTY BREATHING  . Vancomycin Swelling and Rash  . Ace Inhibitors   . Amoxicillin-Pot Clavulanate   . Ciprofloxacin   . Codeine   . Norvasc [Amlodipine Besylate]   . Other     POSICOR  . Bactrim Rash  . Niacin And Related Rash  . Nitrofurantoin Monohyd Macro Rash    Social History:  The patient  reports that she has quit smoking. She does not have any smokeless tobacco history on file. She reports that  drinks alcohol. She reports that she does not use illicit drugs.   ROS:  Please see the history of present illness.   No fever or cough.   All other systems reviewed and negative.   PHYSICAL EXAM: VS:  BP 114/63  Pulse 60  Ht 4\' 10"  (1.473 m)  Wt 92 lb (41.731 kg)  BMI 19.23 kg/m2 Well nourished, well developed, in no acute distress HEENT: normal Neck: + JVD at 90 degrees Cardiac:  normal S1, S2; irreg irreg rhythm; 2-3/6 holosystolic murmur along LLSB/apex Lungs:  Decreased BS bilaterally, no wheezing, rhonchi or rales Abd: soft, nontender, no hepatomegaly Ext: trace bilateral LE edema Skin: warm and dry Neuro:  CNs 2-12 intact, no focal abnormalities noted  EKG:  AFib, HR 60     ASSESSMENT AND PLAN:  1. Chronic Diastolic CHF:  Volume improved.  Continue current Rx. We discussed weighing daily and that she can take an extra Lasix if weight goes up 2-3 lbs in one day. 2. CAD:  No angina.  Continue statin.  She is not on ASA due to coumadin Rx.   3. Valvular Heart Disease:  She is not a surgical candidate. 4. Atrial Fibrillation:  Rate controlled.  She needs her INR checked today. 5. Hypertension:  Controlled.  Continue current therapy.  6. Disposition:  F/u with Dr. [name] in Oct as planned.   Signed, Tereso Newcomer, PA-C  11/19/2012 3:08 PM

## 2012-11-19 NOTE — Patient Instructions (Addendum)
YOU WILL BE HAVING YOUR COUMADIN CHECKED TODAY   KEEP YOUR APPOINTMENT WITH DR. Shirlee Latch [phone]  Your physician recommends that you continue on your current medications as directed. Please refer to the Current Medication list given to you today.

## 2012-12-01 ENCOUNTER — Emergency Department (HOSPITAL_COMMUNITY): Payer: Medicare Other

## 2012-12-01 ENCOUNTER — Encounter (HOSPITAL_COMMUNITY): Payer: Self-pay | Admitting: Emergency Medicine

## 2012-12-01 ENCOUNTER — Emergency Department (HOSPITAL_COMMUNITY)
Admission: EM | Admit: 2012-12-01 | Discharge: 2012-12-01 | Disposition: A | Discharge status: Home / Self Care | Payer: Medicare Other | Attending: Emergency Medicine | Admitting: Emergency Medicine

## 2012-12-01 DIAGNOSIS — Z79899 Other long term (current) drug therapy: Secondary | ICD-10-CM | POA: Insufficient documentation

## 2012-12-01 DIAGNOSIS — Z8589 Personal history of malignant neoplasm of other organs and systems: Secondary | ICD-10-CM | POA: Insufficient documentation

## 2012-12-01 DIAGNOSIS — Z7901 Long term (current) use of anticoagulants: Secondary | ICD-10-CM | POA: Insufficient documentation

## 2012-12-01 DIAGNOSIS — W19XXXA Unspecified fall, initial encounter: Secondary | ICD-10-CM

## 2012-12-01 DIAGNOSIS — Z85828 Personal history of other malignant neoplasm of skin: Secondary | ICD-10-CM | POA: Insufficient documentation

## 2012-12-01 DIAGNOSIS — E785 Hyperlipidemia, unspecified: Secondary | ICD-10-CM | POA: Insufficient documentation

## 2012-12-01 DIAGNOSIS — N189 Chronic kidney disease, unspecified: Secondary | ICD-10-CM | POA: Insufficient documentation

## 2012-12-01 DIAGNOSIS — I2510 Atherosclerotic heart disease of native coronary artery without angina pectoris: Secondary | ICD-10-CM | POA: Insufficient documentation

## 2012-12-01 DIAGNOSIS — I129 Hypertensive chronic kidney disease with stage 1 through stage 4 chronic kidney disease, or unspecified chronic kidney disease: Secondary | ICD-10-CM | POA: Insufficient documentation

## 2012-12-01 DIAGNOSIS — W010XXA Fall on same level from slipping, tripping and stumbling without subsequent striking against object, initial encounter: Secondary | ICD-10-CM | POA: Insufficient documentation

## 2012-12-01 DIAGNOSIS — S0003XA Contusion of scalp, initial encounter: Secondary | ICD-10-CM | POA: Insufficient documentation

## 2012-12-01 DIAGNOSIS — I5032 Chronic diastolic (congestive) heart failure: Secondary | ICD-10-CM | POA: Insufficient documentation

## 2012-12-01 DIAGNOSIS — Z86711 Personal history of pulmonary embolism: Secondary | ICD-10-CM | POA: Insufficient documentation

## 2012-12-01 DIAGNOSIS — Y9301 Activity, walking, marching and hiking: Secondary | ICD-10-CM | POA: Insufficient documentation

## 2012-12-01 DIAGNOSIS — Z8739 Personal history of other diseases of the musculoskeletal system and connective tissue: Secondary | ICD-10-CM | POA: Insufficient documentation

## 2012-12-01 DIAGNOSIS — S0083XA Contusion of other part of head, initial encounter: Secondary | ICD-10-CM

## 2012-12-01 DIAGNOSIS — E039 Hypothyroidism, unspecified: Secondary | ICD-10-CM | POA: Insufficient documentation

## 2012-12-01 DIAGNOSIS — I4891 Unspecified atrial fibrillation: Secondary | ICD-10-CM | POA: Insufficient documentation

## 2012-12-01 DIAGNOSIS — Z87891 Personal history of nicotine dependence: Secondary | ICD-10-CM | POA: Insufficient documentation

## 2012-12-01 DIAGNOSIS — Y9229 Other specified public building as the place of occurrence of the external cause: Secondary | ICD-10-CM | POA: Insufficient documentation

## 2012-12-01 LAB — PROTIME-INR: Prothrombin Time: 28.6 seconds — ABNORMAL HIGH (ref 11.6–15.2)

## 2012-12-01 LAB — APTT: aPTT: 48 seconds — ABNORMAL HIGH (ref 24–37)

## 2012-12-01 NOTE — ED Notes (Signed)
Patient fell 3 days ago and has bruising to her bilateral eyes and nasal bridge. The patient was seen at her PMD today she would like her to have a head CT and blood work

## 2012-12-01 NOTE — ED Provider Notes (Signed)
CSN: [id number]     Arrival date & time 12/01/12  1857 History   First MD Initiated Contact with Patient 12/01/12 2045     Chief Complaint  Patient presents with  . Head Injury   (Consider location/radiation/quality/duration/timing/severity/associated sxs/prior Treatment) HPI Patient presents emergency department following a fall that occurred on Friday evening.  Patient, states she was walking in an ice cream shop when she tripped and landed against a brick wall.  Patient, states, that she has swelling and pain to her face.  Patient has bruising and some discomfort to her right hip as well.  Patient is on Coumadin.  Patient denies chest pain, shortness of breath, syncope, loss consciousness, nausea, vomiting, abdominal pain, neck pain, back pain, numbness, weakness, or dizziness.  Patient, states, that she did not take any medications prior to arrival Past Medical History  Diagnosis Date  . Hypertension   . PMR (polymyalgia rheumatica)   . Valvular heart disease     a. last echo 10/12 with mod to severe MR, mod to severe TR  . Renal artery stenosis     a. L RA 50% by angio 8/02  . Atrial fibrillation     permanent since 2003  . CAD (coronary artery disease)     a. s/p Cypher DES to [facility] 04/2002;  b. Lex Myoview 2011:  EF 67%, normal   . Hyperlipidemia   . Staphylococcal sepsis     MRSA sepsis 2006  . Chronic diastolic heart failure     a. Echo (10/12):  EF 65%, mild AI, mod to severe MR, mod to severe TR, PASP 84  -  on home O2 since 03/2011  . Hypothyroidism   . History of pulmonary embolism   . Pulmonary hypertension     suspect secondary to elevated LA pressure as well as porbable reactive pulmonary vascular changes  . CKD (chronic kidney disease)   . Squamous cell skin cancer     right leg   Past Surgical History  Procedure Laterality Date  . Appendectomy  1942  . Other surgical history  1957    HYSTERECTOMY   Family History  Problem Relation Age of Onset  . Heart  disease Mother   . Cancer Father    History  Substance Use Topics  . Smoking status: Former Games developer  . Smokeless tobacco: Not on file     Comment: quit in  the 1950's  . Alcohol Use: Yes   OB History   Grav Para Term Preterm Abortions TAB SAB Ect Mult Living                 Review of Systems All other systems negative except as documented in the HPI. All pertinent positives and negatives as reviewed in the HPI. Allergies  Dyazide; Procardia; Vancomycin; Ace inhibitors; Amoxicillin-pot clavulanate; Ciprofloxacin; Codeine; Norvasc; Other; Bactrim; Niacin and related; and Nitrofurantoin monohyd macro  Home Medications   Current Outpatient Rx  Name  Route  Sig  Dispense  Refill  . bisoprolol (ZEBETA) 10 MG tablet      TAKE 1 TABLET (10 MG TOTAL) BY MOUTH DAILY.   90 tablet   3   . Calcium Carbonate-Vitamin D (CALCIUM 500 + D PO)   Oral   Take by mouth daily.         . Coenzyme Q10 (COQ10 PO)   Oral   Take 400 mg by mouth.         . furosemide (LASIX) 40 MG tablet  Oral   Take 40 mg by mouth 2 (two) times daily.         Marland Kitchen levothyroxine (SYNTHROID, LEVOTHROID) 50 MCG tablet   Oral   Take 50 mcg by mouth daily before breakfast.         . lisinopril (PRINIVIL,ZESTRIL) 20 MG tablet      TAKE 1 TABLET BY MOUTH TWICE DAILY   180 tablet   1   . lovastatin (MEVACOR) 20 MG tablet   Oral   Take 20 mg by mouth at bedtime.         Marland Kitchen warfarin (COUMADIN) 1 MG tablet   Oral   Take 1-1.5 mg by mouth daily. Take 1 tablet on Sunday,Wednesday, & Friday Take 1.5 tablets on all other days          BP 155/88  Pulse 73  Temp(Src) 97.6 F (36.4 C) (Oral)  Resp 16  Wt 91 lb (41.277 kg)  BMI 19.02 kg/m2  SpO2 100% Physical Exam  Nursing note and vitals reviewed. Constitutional: She is oriented to person, place, and time. She appears well-developed and well-nourished. No distress.  HENT:  Head: Normocephalic.  Mouth/Throat: Oropharynx is clear and moist.    Patient has swelling/bruising to the bilateral  foreHead, under both eyes and her nose  Eyes: Pupils are equal, round, and reactive to light.  Neck: Normal range of motion. Neck supple.  Cardiovascular: Normal rate, regular rhythm and normal heart sounds.  Exam reveals no gallop and no friction rub.   No murmur heard. Pulmonary/Chest: Effort normal and breath sounds normal.  Musculoskeletal: She exhibits no edema.       Right hip: She exhibits normal range of motion, normal strength, no bony tenderness and no swelling.       Legs: Neurological: She is alert and oriented to person, place, and time.  Skin: Skin is warm and dry. No rash noted.    ED Course  Procedures (including critical care time) Labs Review Labs Reviewed  APTT - Abnormal; Notable for the following:    aPTT 48 (*)    All other components within normal limits  PROTIME-INR - Abnormal; Notable for the following:    Prothrombin Time 28.6 (*)    INR 2.81 (*)    All other components within normal limits   Imaging Review Dg Hip Complete Right  12/01/2012   CLINICAL DATA:  Fall. Right lateral hip pain.  EXAM: RIGHT HIP - COMPLETE 2+ VIEW  COMPARISON:  None.  FINDINGS: No acute or focal bony or joint abnormality is identified. The patient has marked bilateral hip osteoarthritis which appears fairly symmetric. Lower lumbar spondylosis is also identified. There is degenerative change about the sacroiliac joints. Transitional anatomy at the lumbosacral junction is noted.  IMPRESSION: No acute or focal abnormality.  Moderate to advanced bilateral hip osteoarthritis appears fairly symmetric.  Lower lumbar spondylosis.   Electronically Signed   By: Drusilla Kanner M.D.   On: 12/01/2012 21:41   Ct Head Wo Contrast  12/01/2012   CLINICAL DATA:  Head injury. Pain.  EXAM: CT HEAD WITHOUT CONTRAST  CT CERVICAL SPINE WITHOUT CONTRAST  TECHNIQUE: Multidetector CT imaging of the head and cervical spine was performed following the standard  protocol without intravenous contrast. Multiplanar CT image reconstructions of the cervical spine were also generated.  COMPARISON:  None.  FINDINGS: CT HEAD FINDINGS  There is a frontal scalp hematoma. There is no underlying fracture.  The ventricles are normal in size and configuration. There  are no parenchymal masses or mass effect. There is no evidence of a recent infarct. There are no extra-axial masses or abnormal fluid collections.  There is no intracranial hemorrhage.  The visualized sinuses and mastoid air cells are clear.  CT CERVICAL SPINE FINDINGS  No fracture. There are grade 1 anterolisthesis these of C3 on C4 and C4 on C5 better degenerative. There is marked disk degenerative change at C5-C6 and C7-T1 with moderate degenerative change at C6-C7. Facet degenerative changes are noted bilaterally. There are multiple levels of neural foraminal narrowing. The bones are demineralized.  The soft tissues show carotid vascular calcifications. A borderline enlarged right superior mediastinal lymph node is noted measuring 9 mm in short axis. The lung apices show a left pleural effusion but are otherwise clear.  IMPRESSION: CT HEAD IMPRESSION  No acute intracranial abnormalities. No skull fracture.  CT CERVICAL SPINE IMPRESSION  No fracture or acute bony abnormality.  Left pleural effusion   Electronically Signed   By: Amie Portland   On: 12/01/2012 20:58   Ct Cervical Spine Wo Contrast  12/01/2012   CLINICAL DATA:  Head injury. Pain.  EXAM: CT HEAD WITHOUT CONTRAST  CT CERVICAL SPINE WITHOUT CONTRAST  TECHNIQUE: Multidetector CT imaging of the head and cervical spine was performed following the standard protocol without intravenous contrast. Multiplanar CT image reconstructions of the cervical spine were also generated.  COMPARISON:  None.  FINDINGS: CT HEAD FINDINGS  There is a frontal scalp hematoma. There is no underlying fracture.  The ventricles are normal in size and configuration. There are no  parenchymal masses or mass effect. There is no evidence of a recent infarct. There are no extra-axial masses or abnormal fluid collections.  There is no intracranial hemorrhage.  The visualized sinuses and mastoid air cells are clear.  CT CERVICAL SPINE FINDINGS  No fracture. There are grade 1 anterolisthesis these of C3 on C4 and C4 on C5 better degenerative. There is marked disk degenerative change at C5-C6 and C7-T1 with moderate degenerative change at C6-C7. Facet degenerative changes are noted bilaterally. There are multiple levels of neural foraminal narrowing. The bones are demineralized.  The soft tissues show carotid vascular calcifications. A borderline enlarged right superior mediastinal lymph node is noted measuring 9 mm in short axis. The lung apices show a left pleural effusion but are otherwise clear.  IMPRESSION: CT HEAD IMPRESSION  No acute intracranial abnormalities. No skull fracture.  CT CERVICAL SPINE IMPRESSION  No fracture or acute bony abnormality.  Left pleural effusion   Electronically Signed   By: Amie Portland   On: 12/01/2012 20:58   Patient does not have a CT scan abnormalities, and she is able to ambulate without difficulty.  Patient is advised followup with her primary care Dr. told to return here as needed. MDM      Carlyle Dolly, PA-C 12/01/12 2209

## 2012-12-03 ENCOUNTER — Ambulatory Visit (INDEPENDENT_AMBULATORY_CARE_PROVIDER_SITE_OTHER): Payer: Medicare Other | Admitting: *Deleted

## 2012-12-03 DIAGNOSIS — I4891 Unspecified atrial fibrillation: Secondary | ICD-10-CM

## 2012-12-03 LAB — POCT INR: INR: 3.3

## 2012-12-03 NOTE — ED Provider Notes (Signed)
Medical screening examination/treatment/procedure(s) were performed by non-physician practitioner and as supervising physician I was immediately available for consultation/collaboration.  Derwood Kaplan, MD 12/03/12 [phone]

## 2012-12-05 ENCOUNTER — Other Ambulatory Visit: Payer: Self-pay | Admitting: Cardiology

## 2012-12-22 ENCOUNTER — Ambulatory Visit (INDEPENDENT_AMBULATORY_CARE_PROVIDER_SITE_OTHER): Payer: Medicare Other | Admitting: *Deleted

## 2012-12-22 ENCOUNTER — Ambulatory Visit (INDEPENDENT_AMBULATORY_CARE_PROVIDER_SITE_OTHER): Payer: Medicare Other | Admitting: Cardiology

## 2012-12-22 ENCOUNTER — Encounter: Payer: Self-pay | Admitting: Cardiology

## 2012-12-22 VITALS — BP 114/60 | HR 66 | Ht <= 58 in | Wt 92.0 lb

## 2012-12-22 DIAGNOSIS — I5032 Chronic diastolic (congestive) heart failure: Secondary | ICD-10-CM

## 2012-12-22 DIAGNOSIS — I38 Endocarditis, valve unspecified: Secondary | ICD-10-CM

## 2012-12-22 DIAGNOSIS — R0602 Shortness of breath: Secondary | ICD-10-CM

## 2012-12-22 DIAGNOSIS — I4891 Unspecified atrial fibrillation: Secondary | ICD-10-CM

## 2012-12-22 DIAGNOSIS — I509 Heart failure, unspecified: Secondary | ICD-10-CM

## 2012-12-22 DIAGNOSIS — I2510 Atherosclerotic heart disease of native coronary artery without angina pectoris: Secondary | ICD-10-CM

## 2012-12-22 LAB — POCT INR: INR: 3.4

## 2012-12-22 MED ORDER — FUROSEMIDE 40 MG PO TABS: ORAL_TABLET | ORAL | Status: DC

## 2012-12-22 MED ORDER — ALBUTEROL SULFATE HFA 108 (90 BASE) MCG/ACT IN AERS: 2.0000 | INHALATION_SPRAY | Freq: Four times a day (QID) | RESPIRATORY_TRACT | Status: DC | PRN

## 2012-12-22 MED ORDER — POTASSIUM CHLORIDE 20 MEQ PO PACK: 20.0000 meq | PACK | Freq: Every day | ORAL | Status: DC

## 2012-12-22 NOTE — Patient Instructions (Signed)
Increase lasix to 60mg  two times a day. This will be 1 and 1/2 of your 40mg  tablets two times a day.   Take KCL(potassium) 20 mEq daily. I sent in a prescription for a potassium powder.  Use albuterol inhaler 2 puffs every 6 hours as needed for wheezing.   Your physician recommends that you return for lab work in: 10 days--BMET/BNP.  Your physician recommends that you schedule a follow-up appointment in: 1 month with Dr Shirlee Latch.

## 2012-12-22 NOTE — Progress Notes (Signed)
Patient ID: Janice Petersen, female   DOB: 1916/01/14, 79 y.o.   MRN: [id number] PCP: Paulino Rily  79 yo with history of diastolic CHF, valvular heart disease, CAD, and chronic atrial fibrillation presents for cardiology evaluation.  She used to live in [facility] and was followed by a cardiologist down there.  She has moved to [facility] to be nearer her family (currently living with one of her sons).  She had a stent placed in her RCA in 2004.  She has had a long history of valvular disease, last echo showed moderate to severe MR and moderate to severe TR with severe pulmonary hypertension.  She has a history of diastolic CHF.  She has been in atrial fibrillation since 2003 on warfarin.  Patient continues to be short of breath with exertion.  Weight is stable.  She gets short of breath walking around her house or making up her bed.  She wheezes at night often.  No chest pain.  She recently tripped and fell with extensive facial bruising.  CT head was negative.  No lightheadedness or syncope.   Labs (2/13): K 4.1, creatinine 1.1 => 1.4, proBNP 683, LDL 54, HDL 44 Labs (4/13): Creatinine 1.87, K 4.9, BNP 486 Labs (6/13): K 3.6, creatinine 1.4 Labs (2/14): K 3.8, creatinine 1.3, BNP 446 Labs (6/14): K 3.6, creatinine 1.4, BNP 733 Labs (8/14): K 3.8, creatinine 1.3, BNP 721, LDL 49, HDl 52  ECG: Atrial fibrillation, rSR' V1, nonspecific T wave flattening  PMH: 1. Diastolic CHF: Echo (10/12) with EF 65%, mild AI, moderate to severe MR, moderate to severe TR, PA systolic pressure 84 mmHg.  She has been on home oxygen since 1/13.  2. Chronic atrial fibrillation since 2003. 3. PMR 4. H/o appendectomy 5. TAH 6. Renal artery stenosis: 50% left renal artery stenosis found on angiogram in 8/02.  7. Hypothyroidism 8. MRSA sepsis in 2006 9. Hyperlipidemia 10. H/o PE 11. CAD: Cypher DES to proximal RCA in 2/04.  Last Lexiscan myoview in 2011 showed EF 67%, normal.  12. Valvular heart disease: Last echo in  10/12 showed moderate to severe MR, moderate to severe TR.  13. Pulmonary hypertension: Suspect secondary to elevated left atrial pressure as well as probable reactive pulmonary vascular changes.  14. CKD 15. Squamous cell CA right leg  SH: Widow. Moved from [place] to [place] and living with her son.  Has 3 sons.  Former smoker (quit years ago).  Rare ETOH.   FH: Mother with CHF at 1, father with multiple myeloma.   ROS: All systems reviewed and negative except as per HPI.   Current Outpatient Prescriptions  Medication Sig Dispense Refill  . bisoprolol (ZEBETA) 10 MG tablet TAKE 1 TABLET (10 MG TOTAL) BY MOUTH DAILY.  90 tablet  3  . Calcium Carbonate-Vitamin D (CALCIUM 500 + D PO) Take by mouth daily.      . Coenzyme Q10 (COQ10 PO) Take 400 mg by mouth.      . isosorbide mononitrate (IMDUR) 30 MG 24 hr tablet TAKE ONE TABLET DAILY      . levothyroxine (SYNTHROID, LEVOTHROID) 50 MCG tablet Take 50 mcg by mouth daily before breakfast.      . lisinopril (PRINIVIL,ZESTRIL) 20 MG tablet TAKE 1 TABLET BY MOUTH TWICE DAILY  180 tablet  1  . lovastatin (MEVACOR) 20 MG tablet Take 20 mg by mouth at bedtime.      Marland Kitchen warfarin (COUMADIN) 1 MG tablet Take 1-1.5 mg by mouth daily. Take  1 tablet on Sunday,Wednesday, & Friday Take 1.5 tablets on all other days      . albuterol (PROVENTIL HFA;VENTOLIN HFA) 108 (90 BASE) MCG/ACT inhaler Inhale 2 puffs into the lungs every 6 (six) hours as needed for wheezing.  1 Inhaler  2  . furosemide (LASIX) 40 MG tablet 1 and 1/2 tablets (total 60mg ) two times a day  90 tablet  3  . potassium chloride (KLOR-CON) 20 MEQ packet Take 20 mEq by mouth daily.  30 packet  6   No current facility-administered medications for this visit.    BP 114/60  Pulse 66  Ht 4\' 10"  (1.473 m)  Wt 92 lb (41.731 kg)  BMI 19.23 kg/m2 General: frail, NAD Neck: JVP 10 cm, no thyromegaly or thyroid nodule.  Lungs: Slight bilateral basilar crackles CV: Nondisplaced PMI.  Heart  irregular S1/S2, no S3/S4, 2/6 HSM LLSB/apex.  1+ left leg edema 1/2 to knee, 1+ right ankle edema,  No carotid bruit.   Abdomen: Soft, nontender, no hepatosplenomegaly, no distention.     Neurologic: Alert and oriented x 3.  Psych: Normal affect. Extremities: No clubbing or cyanosis.   Assessment/Plan:  Atrial fibrillation  Chronic atrial fibrillation.  Continue warfarin. She had a mechanical fall recently.  This is her only fall in recent years.  She needs to use a cane at all times.  She is tolerating bisoprolol with good rate control.   CAD  Stable with no chest pain. Continue warfarin (no indication to add ASA), statin, beta blocker, ACEI.  Diastolic CHF, chronic  She continues to have NYHA class III symptoms and is volume overloaded on exam.  Today I will increase Lasix to 60 mg bid and add KCl 20 daily.  She will need BMET/BNP in 10 days.  Pulmonary hypertension  Probably secondary to diastolic LV failure with elevated LA pressure and reactive pulmonary artery changes.  Valvular heart disease  Moderate to severe MR and TR, likely significant contributors to diastolic CHF and pulmonary HTN. Not an operative candidate.  Hypertension BP is well-controlled.     Followup in 1 month.     [name] 12/22/2012

## 2012-12-25 ENCOUNTER — Telehealth: Payer: Self-pay | Admitting: *Deleted

## 2012-12-25 NOTE — Telephone Encounter (Signed)
[employer] denies klor con powder under all circumstances not covered on this patient medicare D plan,  spoke with son and instructed insurance wont cover powder klor con, he's picked up and paid out of pocket and is giving to patient

## 2013-01-01 ENCOUNTER — Ambulatory Visit (INDEPENDENT_AMBULATORY_CARE_PROVIDER_SITE_OTHER): Payer: Medicare Other | Admitting: General Practice

## 2013-01-01 ENCOUNTER — Other Ambulatory Visit (INDEPENDENT_AMBULATORY_CARE_PROVIDER_SITE_OTHER): Payer: Medicare Other

## 2013-01-01 DIAGNOSIS — Z23 Encounter for immunization: Secondary | ICD-10-CM

## 2013-01-01 DIAGNOSIS — I38 Endocarditis, valve unspecified: Secondary | ICD-10-CM

## 2013-01-01 DIAGNOSIS — I509 Heart failure, unspecified: Secondary | ICD-10-CM

## 2013-01-01 DIAGNOSIS — I4891 Unspecified atrial fibrillation: Secondary | ICD-10-CM

## 2013-01-01 DIAGNOSIS — R0602 Shortness of breath: Secondary | ICD-10-CM

## 2013-01-01 DIAGNOSIS — I5032 Chronic diastolic (congestive) heart failure: Secondary | ICD-10-CM

## 2013-01-01 DIAGNOSIS — I2510 Atherosclerotic heart disease of native coronary artery without angina pectoris: Secondary | ICD-10-CM

## 2013-01-01 LAB — BASIC METABOLIC PANEL
Calcium: 8.7 mg/dL (ref 8.4–10.5)
Chloride: 99 mEq/L (ref 96–112)
Creatinine, Ser: 1.5 mg/dL — ABNORMAL HIGH (ref 0.4–1.2)
GFR: 35.61 mL/min — ABNORMAL LOW (ref >60.00)
Glucose, Bld: 173 mg/dL — ABNORMAL HIGH (ref 70–99)
Sodium: 137 mEq/L (ref 135–145)

## 2013-01-01 LAB — BRAIN NATRIURETIC PEPTIDE: Pro B Natriuretic peptide (BNP): 839 pg/mL — ABNORMAL HIGH (ref 0.0–100.0)

## 2013-01-01 LAB — POCT INR: INR: 2.2

## 2013-01-02 ENCOUNTER — Telehealth: Payer: Self-pay | Admitting: *Deleted

## 2013-01-02 DIAGNOSIS — I4891 Unspecified atrial fibrillation: Secondary | ICD-10-CM

## 2013-01-02 DIAGNOSIS — R0602 Shortness of breath: Secondary | ICD-10-CM

## 2013-01-02 NOTE — Telephone Encounter (Signed)
Reviewed with Dr Darvin Neighbours recommended pt increase lasix to 80mg  two times a day, repeat BMET in 1 week, see PA/NP in 1 week. Pt advised, verbalized understanding.

## 2013-01-02 NOTE — Telephone Encounter (Signed)
I spoke with both pt & daughter about recent lab results  Both note that pt is increasingly short of breath with minimal exertion (she is not climbing any steps).  They would like to know if her oxygen can be increased from 3 liters to 4 liters I will forward this message to Dr. Shirlee Latch & Thurston Hole his nurse Mylo Red RN

## 2013-01-02 NOTE — Telephone Encounter (Signed)
Message copied by Barrie Folk on Fri Jan 02, 2013  2:16 PM ------      Message from: Laurey Morale      Created: Fri Jan 02, 2013  7:00 AM       stable ------

## 2013-01-02 NOTE — Telephone Encounter (Signed)
Reviewed with Dr McLean 

## 2013-01-08 ENCOUNTER — Encounter: Payer: Self-pay | Admitting: Physician Assistant

## 2013-01-08 ENCOUNTER — Other Ambulatory Visit: Payer: Medicare Other

## 2013-01-08 ENCOUNTER — Ambulatory Visit (INDEPENDENT_AMBULATORY_CARE_PROVIDER_SITE_OTHER): Payer: Medicare Other | Admitting: Physician Assistant

## 2013-01-08 ENCOUNTER — Ambulatory Visit
Admission: RE | Admit: 2013-01-08 | Discharge: 2013-01-08 | Disposition: A | Discharge status: Home / Self Care | Payer: Medicare Other | Source: Ambulatory Visit | Attending: Physician Assistant | Admitting: Physician Assistant

## 2013-01-08 VITALS — BP 100/60 | HR 71 | Ht <= 58 in | Wt 94.4 lb

## 2013-01-08 DIAGNOSIS — R0602 Shortness of breath: Secondary | ICD-10-CM

## 2013-01-08 DIAGNOSIS — I10 Essential (primary) hypertension: Secondary | ICD-10-CM

## 2013-01-08 DIAGNOSIS — I5033 Acute on chronic diastolic (congestive) heart failure: Secondary | ICD-10-CM

## 2013-01-08 DIAGNOSIS — I272 Pulmonary hypertension, unspecified: Secondary | ICD-10-CM

## 2013-01-08 DIAGNOSIS — E785 Hyperlipidemia, unspecified: Secondary | ICD-10-CM

## 2013-01-08 DIAGNOSIS — I4891 Unspecified atrial fibrillation: Secondary | ICD-10-CM

## 2013-01-08 DIAGNOSIS — I38 Endocarditis, valve unspecified: Secondary | ICD-10-CM

## 2013-01-08 DIAGNOSIS — I2510 Atherosclerotic heart disease of native coronary artery without angina pectoris: Secondary | ICD-10-CM

## 2013-01-08 DIAGNOSIS — I2789 Other specified pulmonary heart diseases: Secondary | ICD-10-CM

## 2013-01-08 LAB — BASIC METABOLIC PANEL
BUN: 33 mg/dL — ABNORMAL HIGH (ref 6–23)
Calcium: 9.1 mg/dL (ref 8.4–10.5)
Chloride: 99 mEq/L (ref 96–112)
GFR: 39 mL/min — ABNORMAL LOW (ref >60.00)
Glucose, Bld: 172 mg/dL — ABNORMAL HIGH (ref 70–99)
Potassium: 4.2 mEq/L (ref 3.5–5.1)
Sodium: 139 mEq/L (ref 135–145)

## 2013-01-08 LAB — BRAIN NATRIURETIC PEPTIDE: Pro B Natriuretic peptide (BNP): 872 pg/mL — ABNORMAL HIGH (ref 0.0–100.0)

## 2013-01-08 NOTE — Patient Instructions (Signed)
Your physician recommends that you continue on your current medications as directed. Please refer to the Current Medication list given to you today.   Your physician recommends that you have lab work today:  BMET/BNP   Your physician recommends that you KEEP YOUR follow-up appointment WITH DR. Shirlee Latch  A chest x-ray takes a picture of the organs and structures inside the chest, including the heart, lungs, and blood vessels. DX:  SOB This test can show several things, including, whether the heart is enlarges; whether fluid is building up in the lungs; and whether pacemaker / defibrillator leads are still in place. [address]

## 2013-01-08 NOTE — Progress Notes (Signed)
[address], [address] [place], [place]  [address] Phone: [phone] Fax:  [phone]  Date:  01/08/2013   ID:  Janice Petersen, DOB 04-24-1915, MRN [id number]  PCP:  Emeterio Reeve, MD  Cardiologist:  Dr. [name]     History of Present Illness: Janice Petersen is a 79 y.o. female who returns for follow up on CHF.  She has a hx of diastolic CHF, valvular heart disease, CAD, and chronic atrial fibrillation.  She used to live in [facility] and was followed by a cardiologist there. She moved to [facility] to be nearer her family (currently living with one of her sons). She had a stent placed in her RCA in 2004. She has had a long history of valvular disease, last echo showed moderate to severe MR and moderate to severe TR with severe pulmonary hypertension. She has a history of diastolic CHF. She has been in atrial fibrillation since 2003 and is on warfarin.   Last seen by Dr. Shirlee Latch 12/22/12. She was volume overloaded. Lasix was adjusted.  Patient called in recently with increasing shortness of breath. Lasix was further adjusted and she was asked to followup today.  She has had marginal improvement.  She still notes NYHA class III dyspnea. Denies chest pain.  Sleeps on 2 pillows.  Has occasional PND.  LE edema stable.  No syncope.    Labs (2/13): K 4.1, creatinine 1.1 => 1.4, proBNP 683, LDL 54, HDL 44  Labs (4/13): Creatinine 1.87, K 4.9, BNP 486  Labs (6/13): K 3.6, creatinine 1.4  Labs (2/14): K 3.8, creatinine 1.3, BNP 446  Labs (6/14): K 3.6, creatinine 1.4, BNP 733  Labs (8/14): K 3.8, Cr 1.3, proBNP 721, HDL 51.7, LDL 49,  Labs (10/14):  K 4.2, creatinine 1.5, BNP 839  Wt Readings from Last 3 Encounters:  01/08/13 94 lb 6.4 oz (42.82 kg)  12/22/12 92 lb (41.731 kg)  12/01/12 91 lb (41.277 kg)     Past Medical History  Diagnosis Date  . Hypertension   . PMR (polymyalgia rheumatica)   . Valvular heart disease     a. last echo 10/12 with mod to severe MR, mod to  severe TR  . Renal artery stenosis     a. L RA 50% by angio 8/02  . Atrial fibrillation     permanent since 2003  . CAD (coronary artery disease)     a. s/p Cypher DES to [facility] 04/2002;  b. Lex Myoview 2011:  EF 67%, normal   . Hyperlipidemia   . Staphylococcal sepsis     MRSA sepsis 2006  . Chronic diastolic heart failure     a. Echo (10/12):  EF 65%, mild AI, mod to severe MR, mod to severe TR, PASP 84  -  on home O2 since 03/2011  . Hypothyroidism   . History of pulmonary embolism   . Pulmonary hypertension     suspect secondary to elevated LA pressure as well as porbable reactive pulmonary vascular changes  . CKD (chronic kidney disease)   . Squamous cell skin cancer     right leg    Past Surgical History  Procedure Laterality Date  . Appendectomy  1942  . Other surgical history  1957    HYSTERECTOMY     Current Outpatient Prescriptions  Medication Sig Dispense Refill  . albuterol (PROVENTIL HFA;VENTOLIN HFA) 108 (90 BASE) MCG/ACT inhaler Inhale 2 puffs into the lungs every 6 (six) hours as needed for  wheezing.  1 Inhaler  2  . bisoprolol (ZEBETA) 10 MG tablet TAKE 1 TABLET (10 MG TOTAL) BY MOUTH DAILY.  90 tablet  3  . Calcium Carbonate-Vitamin D (CALCIUM 500 + D PO) Take by mouth daily.      . Coenzyme Q10 (COQ10 PO) Take 400 mg by mouth.      . furosemide (LASIX) 40 MG tablet 2 tablets (total 80mg ) two times a day      . isosorbide mononitrate (IMDUR) 30 MG 24 hr tablet TAKE ONE TABLET DAILY      . levothyroxine (SYNTHROID, LEVOTHROID) 50 MCG tablet Take 50 mcg by mouth daily before breakfast.      . lisinopril (PRINIVIL,ZESTRIL) 20 MG tablet TAKE 1 TABLET BY MOUTH TWICE DAILY  180 tablet  1  . lovastatin (MEVACOR) 20 MG tablet Take 20 mg by mouth at bedtime.      . potassium chloride (KLOR-CON) 20 MEQ packet Take 20 mEq by mouth daily.  30 packet  6  . warfarin (COUMADIN) 1 MG tablet Take 1-1.5 mg by mouth daily. Take 1 tablet on Sunday,Wednesday, & Friday Take 1.5  tablets on all other days       No current facility-administered medications for this visit.    Allergies:    Allergies  Allergen Reactions  . Dyazide [Hydrochlorothiazide W-Triamterene] Shortness Of Breath  . Procardia [Nifedipine] Shortness Of Breath    DIFFICULTY BREATHING  . Vancomycin Swelling and Rash  . Ace Inhibitors   . Amoxicillin-Pot Clavulanate   . Ciprofloxacin   . Codeine   . Norvasc [Amlodipine Besylate]   . Other     POSICOR  . Bactrim Rash  . Niacin And Related Rash  . Nitrofurantoin Monohyd Macro Rash    Social History:  The patient  reports that she has quit smoking. She does not have any smokeless tobacco history on file. She reports that she drinks alcohol. She reports that she does not use illicit drugs.   ROS:  Please see the history of present illness.   She has a non-productive cough.  All other systems reviewed and negative.   PHYSICAL EXAM: VS:  BP 100/60  Pulse 71  Ht 4\' 8"  (1.422 m)  Wt 94 lb 6.4 oz (42.82 kg)  BMI 21.18 kg/m2 Well nourished, well developed, in no acute distress HEENT: normal Neck: + JVD at 90 degrees Cardiac:  normal S1, S2; irreg irreg rhythm; 2-3/6 holosystolic murmur along LLSB/apex Lungs:  Decreased breath sounds at the left base with positive egophony Abd: soft, nontender, no hepatomegaly Ext: Trace-1+ bilateral LE edema, left greater than right Skin: warm and dry Neuro:  CNs 2-12 intact, no focal abnormalities noted  EKG:  Atrial fibrillation, HR 71     ASSESSMENT AND PLAN:  1. Acute on Chronic Diastolic CHF:  Volume marginally better.  Weights still up.  She is now on Lasix 80 bid.  I will check a CXR given abnormal lung exam.  Check BMET, BNP.  If BNP remains high (or higher) give one dose of Metolazone 2.5.  Of note she is not taking K+ and has increased dietary K+.   2. CAD:  No angina.  Continue statin.  She is not on ASA due to coumadin Rx.   3. Valvular Heart Disease:  She is not a surgical  candidate. 4. Pulmonary HTN:  Probably secondary to diastolic LV failure with elevated LA pressures and reactive pulmonary artery changes.  This is likely contributing to progressively worsening dyspnea.  5. Atrial Fibrillation:  Rate controlled.  Continue warfarin.   6. Hypertension:  Controlled.    7. Disposition:  F/u with Dr. [name] 11/17 as planned.   Signed, Tereso Newcomer, PA-C  01/08/2013 2:20 PM

## 2013-01-09 ENCOUNTER — Telehealth: Payer: Self-pay | Admitting: *Deleted

## 2013-01-09 DIAGNOSIS — I4891 Unspecified atrial fibrillation: Secondary | ICD-10-CM

## 2013-01-09 DIAGNOSIS — I5032 Chronic diastolic (congestive) heart failure: Secondary | ICD-10-CM

## 2013-01-09 DIAGNOSIS — R0602 Shortness of breath: Secondary | ICD-10-CM

## 2013-01-09 MED ORDER — METOLAZONE 2.5 MG PO TABS: 2.5000 mg | ORAL_TABLET | Freq: Every day | ORAL | Status: DC

## 2013-01-09 NOTE — Telephone Encounter (Signed)
s/w pt's daughter-in-law Olegario Messier with pt right there, both aware of cxr and lab results and bmet/bnp 11/3; metolazone 2.5 and  w/K+ ;30 minutes before AM lasix. Olegario Messier verbalized understanding to Plan of Care

## 2013-01-09 NOTE — Telephone Encounter (Signed)
Message copied by Tarri Fuller on Fri Jan 09, 2013  4:20 PM ------      Message from: [place], [place] T      Created: Thu Jan 08, 2013  5:06 PM       K+ ok      Creatinine stable      BNP high      Have patient take one dose of Metolazone 2.5 mg 30 mins before AM Lasix tomorrow (10/31).        Give her a Rx for Metolazone 2.5 mg, # 10, 1 refill.      Take K+ 20 mEq x 1 tomorrow as well.      Check BMET and BNP Monday 11/3.       ------

## 2013-01-12 ENCOUNTER — Other Ambulatory Visit (INDEPENDENT_AMBULATORY_CARE_PROVIDER_SITE_OTHER): Payer: Medicare Other

## 2013-01-12 DIAGNOSIS — R0602 Shortness of breath: Secondary | ICD-10-CM

## 2013-01-12 DIAGNOSIS — I4891 Unspecified atrial fibrillation: Secondary | ICD-10-CM

## 2013-01-12 DIAGNOSIS — I5032 Chronic diastolic (congestive) heart failure: Secondary | ICD-10-CM

## 2013-01-12 LAB — BRAIN NATRIURETIC PEPTIDE: Pro B Natriuretic peptide (BNP): 549 pg/mL — ABNORMAL HIGH (ref 0.0–100.0)

## 2013-01-12 LAB — BASIC METABOLIC PANEL
BUN: 49 mg/dL — ABNORMAL HIGH (ref 6–23)
Chloride: 94 mEq/L — ABNORMAL LOW (ref 96–112)
Creatinine, Ser: 1.7 mg/dL — ABNORMAL HIGH (ref 0.4–1.2)
GFR: 29.84 mL/min — ABNORMAL LOW (ref >60.00)
Potassium: 4.4 mEq/L (ref 3.5–5.1)
Sodium: 140 mEq/L (ref 135–145)

## 2013-01-14 ENCOUNTER — Telehealth: Payer: Self-pay | Admitting: *Deleted

## 2013-01-14 DIAGNOSIS — I5032 Chronic diastolic (congestive) heart failure: Secondary | ICD-10-CM

## 2013-01-14 NOTE — Telephone Encounter (Signed)
Message copied by Tarri Fuller on Wed Jan 14, 2013  8:38 AM ------      Message from: [place], [place] T      Created: Tue Jan 13, 2013 12:54 PM       BNP improved.      Continue current Rx.      Creatinine increased some.       Repeat BMET in 1 week.      Tereso Newcomer, PA-C        01/13/2013 12:54 PM ------

## 2013-01-14 NOTE — Telephone Encounter (Signed)
pt's daughter-in-law notified about lab results and no changes to be made. Pt has appt w/Dr. Shirlee Latch 11/17 and will get bmet that day

## 2013-01-16 ENCOUNTER — Ambulatory Visit (INDEPENDENT_AMBULATORY_CARE_PROVIDER_SITE_OTHER): Payer: Medicare Other | Admitting: *Deleted

## 2013-01-16 DIAGNOSIS — I4891 Unspecified atrial fibrillation: Secondary | ICD-10-CM

## 2013-01-16 LAB — POCT INR: INR: 1.8

## 2013-01-23 ENCOUNTER — Other Ambulatory Visit: Payer: Self-pay | Admitting: Cardiology

## 2013-01-26 ENCOUNTER — Encounter: Payer: Self-pay | Admitting: Cardiology

## 2013-01-26 ENCOUNTER — Other Ambulatory Visit: Payer: Self-pay | Admitting: Cardiology

## 2013-01-26 ENCOUNTER — Ambulatory Visit: Payer: Self-pay | Admitting: Cardiology

## 2013-01-26 ENCOUNTER — Ambulatory Visit (INDEPENDENT_AMBULATORY_CARE_PROVIDER_SITE_OTHER): Payer: Medicare Other | Admitting: Cardiology

## 2013-01-26 ENCOUNTER — Other Ambulatory Visit: Payer: Medicare Other

## 2013-01-26 VITALS — BP 112/66 | Ht <= 58 in | Wt 87.0 lb

## 2013-01-26 DIAGNOSIS — I4891 Unspecified atrial fibrillation: Secondary | ICD-10-CM

## 2013-01-26 DIAGNOSIS — R0602 Shortness of breath: Secondary | ICD-10-CM

## 2013-01-26 DIAGNOSIS — I5032 Chronic diastolic (congestive) heart failure: Secondary | ICD-10-CM

## 2013-01-26 DIAGNOSIS — I10 Essential (primary) hypertension: Secondary | ICD-10-CM

## 2013-01-26 DIAGNOSIS — E785 Hyperlipidemia, unspecified: Secondary | ICD-10-CM

## 2013-01-26 DIAGNOSIS — I2510 Atherosclerotic heart disease of native coronary artery without angina pectoris: Secondary | ICD-10-CM

## 2013-01-26 DIAGNOSIS — I509 Heart failure, unspecified: Secondary | ICD-10-CM

## 2013-01-26 MED ORDER — METOLAZONE 2.5 MG PO TABS: ORAL_TABLET | ORAL | Status: DC

## 2013-01-26 NOTE — Patient Instructions (Signed)
Take metolazone 2.5mg  on Saturdays 30 minutes before you take furosemide (lasix).   Your physician recommends that you return for lab work today--BMET/BNP.  Your physician recommends that you schedule a follow-up appointment in: 1 month with Dr Shirlee Latch.

## 2013-01-27 ENCOUNTER — Telehealth: Payer: Self-pay | Admitting: *Deleted

## 2013-01-27 DIAGNOSIS — I5032 Chronic diastolic (congestive) heart failure: Secondary | ICD-10-CM

## 2013-01-27 LAB — BASIC METABOLIC PANEL
BUN: 116 mg/dL (ref 6–23)
Chloride: 97 mEq/L (ref 96–112)
Creatinine, Ser: 2.4 mg/dL — ABNORMAL HIGH (ref 0.4–1.2)
GFR: 19.62 mL/min — ABNORMAL LOW (ref >60.00)
Potassium: 5 mEq/L (ref 3.5–5.1)

## 2013-01-27 NOTE — Telephone Encounter (Signed)
[facility] calling with critical BUN : 116, creat 2.4 will forward to dr/nurse.

## 2013-01-27 NOTE — Progress Notes (Signed)
Patient ID: Janice Petersen, female   DOB: 1915/11/27, 79 y.o.   MRN: [id number] PCP: Paulino Rily  79 yo with history of diastolic CHF, valvular heart disease, CAD, and chronic atrial fibrillation presents for cardiology evaluation.  She used to live in [facility] and was followed by a cardiologist down there.  She has moved to [facility] to be nearer her family (currently living with one of her sons).  She had a stent placed in her RCA in 2004.  She has had a long history of valvular disease, last echo showed moderate to severe MR and moderate to severe TR with severe pulmonary hypertension.  She has a history of diastolic CHF.  She has been in atrial fibrillation since 2003 on warfarin.  She has had trouble with worsening dyspnea and volume overload.  She saw Tereso Newcomer in the office most recently and was given metolazone 2.5 mg.  She took this daily for a week.  Her weight is down 7 lbs.  She is breathing much better.  She can walk around the house without dyspnea.  She is short of breath with longer distances.  No syncope or lightheadedness.  No chest pain.  Last dose of metolazone was on Saturday.    Labs (2/13): K 4.1, creatinine 1.1 => 1.4, proBNP 683, LDL 54, HDL 44 Labs (4/13): Creatinine 1.87, K 4.9, BNP 486 Labs (6/13): K 3.6, creatinine 1.4 Labs (2/14): K 3.8, creatinine 1.3, BNP 446 Labs (6/14): K 3.6, creatinine 1.4, BNP 733 Labs (8/14): K 3.8, creatinine 1.3, BNP 721, LDL 49, HDl 52 Labs (11/14): K 4.4, creatinine 1.7, BNP 549  PMH: 1. Diastolic CHF: Echo (10/12) with EF 65%, mild AI, moderate to severe MR, moderate to severe TR, PA systolic pressure 84 mmHg.  She has been on home oxygen since 1/13.  2. Chronic atrial fibrillation since 2003. 3. PMR 4. H/o appendectomy 5. TAH 6. Renal artery stenosis: 50% left renal artery stenosis found on angiogram in 8/02.  7. Hypothyroidism 8. MRSA sepsis in 2006 9. Hyperlipidemia 10. H/o PE 11. CAD: Cypher DES to proximal RCA in 2/04.  Last  Lexiscan myoview in 2011 showed EF 67%, normal.  12. Valvular heart disease: Last echo in 10/12 showed moderate to severe MR, moderate to severe TR.  13. Pulmonary hypertension: Suspect secondary to elevated left atrial pressure as well as probable reactive pulmonary vascular changes.  14. CKD 15. Squamous cell CA right leg  SH: Widow. Moved from [place] to [place] and living with her son.  Has 3 sons.  Former smoker (quit years ago).  Rare ETOH.   FH: Mother with CHF at 43, father with multiple myeloma.   ROS: All systems reviewed and negative except as per HPI.   Current Outpatient Prescriptions  Medication Sig Dispense Refill  . albuterol (PROVENTIL HFA;VENTOLIN HFA) 108 (90 BASE) MCG/ACT inhaler Inhale 2 puffs into the lungs every 6 (six) hours as needed for wheezing.  1 Inhaler  2  . bisoprolol (ZEBETA) 10 MG tablet TAKE 1 TABLET (10 MG TOTAL) BY MOUTH DAILY.  90 tablet  3  . Calcium Carbonate-Vitamin D (CALCIUM 500 + D PO) Take by mouth daily.      . Coenzyme Q10 (COQ10 PO) Take 400 mg by mouth.      . furosemide (LASIX) 40 MG tablet 2 tablets (total 80mg ) two times a day      . isosorbide mononitrate (IMDUR) 30 MG 24 hr tablet TAKE ONE TABLET DAILY      .  levothyroxine (SYNTHROID, LEVOTHROID) 50 MCG tablet Take 50 mcg by mouth daily before breakfast.      . lisinopril (PRINIVIL,ZESTRIL) 20 MG tablet TAKE 1 TABLET BY MOUTH TWICE DAILY  180 tablet  1  . lovastatin (MEVACOR) 20 MG tablet Take 20 mg by mouth at bedtime.      . metolazone (ZAROXOLYN) 2.5 MG tablet 1 tablet on Saturdays 30 minutes before you take lasix (furosemide)  10 tablet  1  . NON FORMULARY 3 L daily. 3 Liters of oxygen      . potassium chloride (KLOR-CON) 20 MEQ packet Take 20 mEq by mouth daily.  30 packet  6  . warfarin (COUMADIN) 1 MG tablet Take 1-1.5 mg by mouth daily. Take 1 tablet on Sunday,Wednesday, & Friday Take 1.5 tablets on all other days       No current facility-administered medications for  this visit.    BP 112/66  Ht 4\' 8"  (1.422 m)  Wt 87 lb (39.463 kg)  BMI 19.52 kg/m2 General: frail, NAD Neck: JVP 7 cm, no thyromegaly or thyroid nodule.  Lungs: Slight bilateral basilar crackles CV: Nondisplaced PMI.  Heart irregular S1/S2, no S3/S4, 2/6 HSM LLSB/apex.  No edema.  No carotid bruit.   Abdomen: Soft, nontender, no hepatosplenomegaly, no distention.     Neurologic: Alert and oriented x 3.  Psych: Normal affect. Extremities: No clubbing or cyanosis.   Assessment/Plan:  Atrial fibrillation  Chronic atrial fibrillation.  Continue warfarin. No recent falls.  She is tolerating bisoprolol with good rate control.   CAD  Stable with no chest pain. Continue warfarin (no indication to add ASA), statin, beta blocker, ACEI.  Diastolic CHF, chronic  Volume better on metolazone.  I do not want her taking it too often.   - Continue Lasix 80 mg bid.  - I will have her take metolazone 2.5 mg once weekly on Saturdays.   - BMET/BNP today.   Pulmonary hypertension  Probably secondary to diastolic LV failure with elevated LA pressure and reactive pulmonary artery changes.  Valvular heart disease  Moderate to severe MR and TR, likely significant contributors to diastolic CHF and pulmonary HTN. Not an operative candidate.  Hypertension BP is well-controlled.     Followup in 1 month.     [name] 77/18/2014

## 2013-01-27 NOTE — Telephone Encounter (Signed)
Per Dr Marveen Reeks lasix and KCL for 3 days, then restart lasix and KCL at prior dosing. No more metolazone. BMET 01/30/13. Pt's son Trey Paula notified.

## 2013-01-28 ENCOUNTER — Other Ambulatory Visit: Payer: Self-pay | Admitting: Cardiology

## 2013-01-30 ENCOUNTER — Ambulatory Visit (INDEPENDENT_AMBULATORY_CARE_PROVIDER_SITE_OTHER): Payer: Medicare Other | Admitting: General Practice

## 2013-01-30 ENCOUNTER — Other Ambulatory Visit (INDEPENDENT_AMBULATORY_CARE_PROVIDER_SITE_OTHER): Payer: Medicare Other

## 2013-01-30 DIAGNOSIS — I5032 Chronic diastolic (congestive) heart failure: Secondary | ICD-10-CM

## 2013-01-30 DIAGNOSIS — I4891 Unspecified atrial fibrillation: Secondary | ICD-10-CM

## 2013-01-30 DIAGNOSIS — Z7901 Long term (current) use of anticoagulants: Secondary | ICD-10-CM

## 2013-01-30 LAB — BASIC METABOLIC PANEL
CO2: 25 mEq/L (ref 19–32)
Calcium: 9.6 mg/dL (ref 8.4–10.5)
Chloride: 102 mEq/L (ref 96–112)
Creatinine, Ser: 1.5 mg/dL — ABNORMAL HIGH (ref 0.4–1.2)
Glucose, Bld: 119 mg/dL — ABNORMAL HIGH (ref 70–99)
Sodium: 136 mEq/L (ref 135–145)

## 2013-02-25 ENCOUNTER — Ambulatory Visit (INDEPENDENT_AMBULATORY_CARE_PROVIDER_SITE_OTHER): Payer: Medicare Other | Admitting: Cardiology

## 2013-02-25 ENCOUNTER — Encounter: Payer: Self-pay | Admitting: Cardiology

## 2013-02-25 VITALS — BP 104/62 | HR 69 | Ht <= 58 in | Wt 87.0 lb

## 2013-02-25 DIAGNOSIS — R0602 Shortness of breath: Secondary | ICD-10-CM

## 2013-02-25 DIAGNOSIS — I4891 Unspecified atrial fibrillation: Secondary | ICD-10-CM

## 2013-02-25 DIAGNOSIS — I38 Endocarditis, valve unspecified: Secondary | ICD-10-CM

## 2013-02-25 DIAGNOSIS — I509 Heart failure, unspecified: Secondary | ICD-10-CM

## 2013-02-25 DIAGNOSIS — I2510 Atherosclerotic heart disease of native coronary artery without angina pectoris: Secondary | ICD-10-CM

## 2013-02-25 DIAGNOSIS — I5032 Chronic diastolic (congestive) heart failure: Secondary | ICD-10-CM

## 2013-02-25 NOTE — Patient Instructions (Signed)
Dr Shirlee Latch recommends you take metolazone if your weigh is greater than 90 pounds.  You should take metolazone 2.5 mg thirty minutes before you take lasix (furosemide) if you need to take it.  Do not take it more than 1 time per week.   Your physician recommends that you have lab work today--BMET.  Your physician recommends that you schedule a follow-up appointment in: 2 months with Dr Shirlee Latch.

## 2013-02-26 LAB — BASIC METABOLIC PANEL
BUN: 60 mg/dL — ABNORMAL HIGH (ref 6–23)
CO2: 32 mEq/L (ref 19–32)
Calcium: 9.3 mg/dL (ref 8.4–10.5)
Chloride: 101 mEq/L (ref 96–112)
Creatinine, Ser: 1.5 mg/dL — ABNORMAL HIGH (ref 0.4–1.2)
Glucose, Bld: 107 mg/dL — ABNORMAL HIGH (ref 70–99)

## 2013-02-26 NOTE — Progress Notes (Signed)
Patient ID: Janice Petersen, female   DOB: 07-Jan-1916, 79 y.o.   MRN: [id number] PCP: Paulino Rily  79 yo with history of diastolic CHF, valvular heart disease, CAD, and chronic atrial fibrillation presents for cardiology evaluation.  She used to live in [facility] and was followed by a cardiologist down there.  She has moved to [facility] to be nearer her family (currently living with one of her sons).  She had a stent placed in her RCA in 2004.  She has had a long history of valvular disease, last echo showed moderate to severe MR and moderate to severe TR with severe pulmonary hypertension.  She has a history of diastolic CHF.  She has been in atrial fibrillation since 2003 on warfarin.  Janice Petersen has been stable recently.  She can walk in her house without dyspnea.  She has a lift to get up the steps now.  She has not been using metolazone and creatinine was back down to 1.5 when last checked.  No lightheadedness or syncope.     Labs (2/13): K 4.1, creatinine 1.1 => 1.4, proBNP 683, LDL 54, HDL 44 Labs (4/13): Creatinine 1.87, K 4.9, BNP 486 Labs (6/13): K 3.6, creatinine 1.4 Labs (2/14): K 3.8, creatinine 1.3, BNP 446 Labs (6/14): K 3.6, creatinine 1.4, BNP 733 Labs (8/14): K 3.8, creatinine 1.3, BNP 721, LDL 49, HDl 52 Labs (11/14): K 4.4, creatinine 1.7 => 2.4 => 1.5, BNP 549  PMH: 1. Diastolic CHF: Echo (10/12) with EF 65%, mild AI, moderate to severe MR, moderate to severe TR, PA systolic pressure 84 mmHg.  She has been on home oxygen since 1/13.  2. Chronic atrial fibrillation since 2003. 3. PMR 4. H/o appendectomy 5. TAH 6. Renal artery stenosis: 50% left renal artery stenosis found on angiogram in 8/02.  7. Hypothyroidism 8. MRSA sepsis in 2006 9. Hyperlipidemia 10. H/o PE 11. CAD: Cypher DES to proximal RCA in 2/04.  Last Lexiscan myoview in 2011 showed EF 67%, normal.  12. Valvular heart disease: Last echo in 10/12 showed moderate to severe MR, moderate to severe TR.  13. Pulmonary  hypertension: Suspect secondary to elevated left atrial pressure as well as probable reactive pulmonary vascular changes.  14. CKD 15. Squamous cell CA right leg  SH: Widow. Moved from [place] to [place] and living with her son.  Has 3 sons.  Former smoker (quit years ago).  Rare ETOH.   FH: Mother with CHF at 78, father with multiple myeloma.   Current Outpatient Prescriptions  Medication Sig Dispense Refill  . bisoprolol (ZEBETA) 10 MG tablet TAKE 1 TABLET (10 MG TOTAL) BY MOUTH DAILY.  90 tablet  3  . Calcium Carbonate-Vitamin D (CALCIUM 500 + D PO) Take by mouth daily.      . Coenzyme Q10 (COQ10 PO) Take 400 mg by mouth.      . furosemide (LASIX) 40 MG tablet 2 tablets (total 80mg ) two times a day      . isosorbide mononitrate (IMDUR) 30 MG 24 hr tablet TAKE ONE TABLET DAILY      . levothyroxine (SYNTHROID, LEVOTHROID) 50 MCG tablet Take 50 mcg by mouth daily before breakfast.      . lisinopril (PRINIVIL,ZESTRIL) 20 MG tablet TAKE 1 TABLET BY MOUTH TWICE DAILY  180 tablet  1  . lovastatin (MEVACOR) 20 MG tablet Take 20 mg by mouth at bedtime.      . NON FORMULARY 3 L daily. 3 Liters of oxygen      .  potassium chloride (KLOR-CON) 20 MEQ packet Take 20 mEq by mouth daily.  30 packet  6  . warfarin (COUMADIN) 1 MG tablet Take 1-1.5 mg by mouth daily. Take 1 tablet on Sunday,Wednesday, & Friday Take 1.5 tablets on all other days      . metolazone (ZAROXOLYN) 2.5 MG tablet Weekly as directed 30 minutes before lasix (furosemide)       No current facility-administered medications for this visit.    BP 104/62  Pulse 69  Ht 4\' 8"  (1.422 m)  Wt 39.463 kg (87 lb)  BMI 19.52 kg/m2 General: frail, NAD Neck: JVP 8-9 cm, no thyromegaly or thyroid nodule.  Lungs: Slight bilateral basilar crackles CV: Nondisplaced PMI.  Heart irregular S1/S2, no S3/S4, 3/6 HSM LLSB/apex.  No edema.  No carotid bruit.   Abdomen: Soft, nontender, no hepatosplenomegaly, no distention.     Neurologic:  Alert and oriented x 3.  Psych: Normal affect. Extremities: No clubbing or cyanosis.   Assessment/Plan:  Atrial fibrillation  Chronic atrial fibrillation.  Continue warfarin. No recent falls.  She is tolerating bisoprolol with good rate control.   CAD  Stable with no chest pain. Continue warfarin (no indication to add ASA), statin, beta blocker, ACEI.  Diastolic CHF, chronic  Volume status stable.  Weight is down 2 lbs.    - Continue Lasix 80 mg bid.  - She may take 1 dose of metolazone 2.5 mg if weight rises > 90 lbs.   - BMET today.   Pulmonary hypertension  Probably secondary to diastolic LV failure with elevated LA pressure and reactive pulmonary artery changes.  Valvular heart disease  Moderate to severe MR and TR, likely significant contributors to diastolic CHF and pulmonary HTN. Not an operative candidate.  Hypertension BP is well-controlled.     Followup in 2 months    [name] 02/26/2013

## 2013-03-09 ENCOUNTER — Other Ambulatory Visit: Payer: Self-pay | Admitting: Cardiology

## 2013-03-17 ENCOUNTER — Ambulatory Visit (INDEPENDENT_AMBULATORY_CARE_PROVIDER_SITE_OTHER): Payer: Medicare Other | Admitting: *Deleted

## 2013-03-17 DIAGNOSIS — I4891 Unspecified atrial fibrillation: Secondary | ICD-10-CM

## 2013-03-17 DIAGNOSIS — Z7901 Long term (current) use of anticoagulants: Secondary | ICD-10-CM

## 2013-03-17 LAB — POCT INR: INR: 2.1

## 2013-03-23 ENCOUNTER — Ambulatory Visit (INDEPENDENT_AMBULATORY_CARE_PROVIDER_SITE_OTHER): Payer: Medicare Other | Admitting: Pharmacist

## 2013-03-23 DIAGNOSIS — I4891 Unspecified atrial fibrillation: Secondary | ICD-10-CM

## 2013-03-23 DIAGNOSIS — Z7901 Long term (current) use of anticoagulants: Secondary | ICD-10-CM

## 2013-03-23 LAB — POCT INR: INR: 2.4

## 2013-04-03 ENCOUNTER — Other Ambulatory Visit: Payer: Self-pay

## 2013-04-03 DIAGNOSIS — R0602 Shortness of breath: Secondary | ICD-10-CM

## 2013-04-03 DIAGNOSIS — I4891 Unspecified atrial fibrillation: Secondary | ICD-10-CM

## 2013-04-03 MED ORDER — FUROSEMIDE 40 MG PO TABS: ORAL_TABLET | ORAL | Status: DC

## 2013-04-10 ENCOUNTER — Telehealth: Payer: Self-pay | Admitting: Cardiology

## 2013-04-10 NOTE — Telephone Encounter (Signed)
Returned call to Juliann Pulse patient's daughter n law Dr.McLean advised ok to take Metolazone x 1 30 mins before Lasix.Advised to continue to monitor weight and call back if needed.

## 2013-04-10 NOTE — Telephone Encounter (Signed)
Returned call to patient's daughter n law Juliann Pulse she stated patient weighed 91 lbs this morning.Stated she will take Metolazone 2.5 mg.Stated Dr.McLean wanted to know if her weight got up to 90 lbs.Stated patient also complaining of burning when she urinates and her nose is congested.Stated will call her PCP.Message sent to [place].

## 2013-04-10 NOTE — Telephone Encounter (Signed)
Agree with taking metolazone x 1 today 30 minutes before her am Lasix.

## 2013-04-10 NOTE — Telephone Encounter (Signed)
New problem    Pt is having problems with water retention. Please call pt's daughter-n-law.

## 2013-04-13 ENCOUNTER — Ambulatory Visit (INDEPENDENT_AMBULATORY_CARE_PROVIDER_SITE_OTHER): Payer: Medicare Other | Admitting: *Deleted

## 2013-04-13 DIAGNOSIS — Z7901 Long term (current) use of anticoagulants: Secondary | ICD-10-CM

## 2013-04-13 DIAGNOSIS — Z5181 Encounter for therapeutic drug level monitoring: Secondary | ICD-10-CM

## 2013-04-13 DIAGNOSIS — I4891 Unspecified atrial fibrillation: Secondary | ICD-10-CM

## 2013-04-13 LAB — POCT INR: INR: 2.1

## 2013-04-20 ENCOUNTER — Telehealth: Payer: Self-pay | Admitting: Cardiology

## 2013-04-20 NOTE — Telephone Encounter (Signed)
LM for CVRR to call me  with recommendation for pt.

## 2013-04-20 NOTE — Telephone Encounter (Signed)
Per Dr Maceo Pro to try Imodium. Pt denies any bloody diarrhea.

## 2013-04-20 NOTE — Telephone Encounter (Signed)
New message   Is there any safe patient can take for diarrhea    PCP will be contacted as well.

## 2013-04-28 ENCOUNTER — Other Ambulatory Visit: Payer: Self-pay | Admitting: Cardiology

## 2013-04-30 ENCOUNTER — Ambulatory Visit (INDEPENDENT_AMBULATORY_CARE_PROVIDER_SITE_OTHER): Payer: Medicare Other

## 2013-04-30 ENCOUNTER — Ambulatory Visit (INDEPENDENT_AMBULATORY_CARE_PROVIDER_SITE_OTHER): Payer: Medicare Other | Admitting: Cardiology

## 2013-04-30 ENCOUNTER — Encounter: Payer: Self-pay | Admitting: Cardiology

## 2013-04-30 VITALS — BP 110/60 | HR 69 | Ht <= 58 in | Wt 90.0 lb

## 2013-04-30 DIAGNOSIS — I5032 Chronic diastolic (congestive) heart failure: Secondary | ICD-10-CM

## 2013-04-30 DIAGNOSIS — I509 Heart failure, unspecified: Secondary | ICD-10-CM

## 2013-04-30 DIAGNOSIS — I2510 Atherosclerotic heart disease of native coronary artery without angina pectoris: Secondary | ICD-10-CM

## 2013-04-30 DIAGNOSIS — Z5181 Encounter for therapeutic drug level monitoring: Secondary | ICD-10-CM

## 2013-04-30 DIAGNOSIS — I4891 Unspecified atrial fibrillation: Secondary | ICD-10-CM

## 2013-04-30 DIAGNOSIS — I38 Endocarditis, valve unspecified: Secondary | ICD-10-CM

## 2013-04-30 DIAGNOSIS — Z7901 Long term (current) use of anticoagulants: Secondary | ICD-10-CM

## 2013-04-30 LAB — BASIC METABOLIC PANEL
BUN: 56 mg/dL — AB (ref 6–23)
CO2: 27 meq/L (ref 19–32)
Calcium: 9.5 mg/dL (ref 8.4–10.5)
Chloride: 99 mEq/L (ref 96–112)
Creatinine, Ser: 1.4 mg/dL — ABNORMAL HIGH (ref 0.4–1.2)
GFR: 35.87 mL/min — ABNORMAL LOW (ref >60.00)
GLUCOSE: 87 mg/dL (ref 70–99)
Potassium: 3.9 mEq/L (ref 3.5–5.1)
SODIUM: 135 meq/L (ref 135–145)

## 2013-04-30 LAB — POCT INR: INR: 2.7

## 2013-04-30 NOTE — Progress Notes (Signed)
Patient ID: Janice Petersen, female   DOB: [birth year], 80 y.o.   MRN: [id number] PCP: Stephanie Acre  48 yo with history of diastolic CHF, valvular heart disease, CAD, and chronic atrial fibrillation presents for cardiology evaluation.  She used to live in [facility] and was followed by a cardiologist down there.  She has moved to [facility] to be nearer her family (currently living with one of her sons).  She had a stent placed in her RCA in 2004.  She has had a long history of valvular disease, last echo showed moderate to severe MR and moderate to severe TR with severe pulmonary hypertension.  She has a history of diastolic CHF.  She has been in atrial fibrillation since 2003 on warfarin.  Janice Petersen has been stable recently.  She can walk in her house without dyspnea.  She has a lift to get up the steps now.  She has not been using metolazone and creatinine was back down to 1.5 when last checked.  No lightheadedness or syncope.   She had been having trouble with chronic diarrhea but this has resolved with Imodium use.  No falls.   ECG: atrial fibrillation with septal Qs.   Labs (2/13): K 4.1, creatinine 1.1 => 1.4, proBNP 683, LDL 54, HDL 44 Labs (4/13): Creatinine 1.87, K 4.9, BNP 486 Labs (6/13): K 3.6, creatinine 1.4 Labs (2/14): K 3.8, creatinine 1.3, BNP 446 Labs (6/14): K 3.6, creatinine 1.4, BNP 733 Labs (8/14): K 3.8, creatinine 1.3, BNP 721, LDL 49, HDl 52 Labs (11/14): K 4.4, creatinine 1.7 => 2.4 => 1.5, BNP 549 Labs (12/14): K 4.3, creatinine 1.5  PMH: 1. Diastolic CHF: Echo (54/62) with EF 65%, mild AI, moderate to severe MR, moderate to severe TR, PA systolic pressure 84 mmHg.  She has been on home oxygen since 1/13.  2. Chronic atrial fibrillation since 2003. 3. PMR 4. H/o appendectomy 5. TAH 6. Renal artery stenosis: 50% left renal artery stenosis found on angiogram in 8/02.  7. Hypothyroidism 8. MRSA sepsis in 2006 9. Hyperlipidemia 10. H/o PE 11. CAD: Cypher DES to proximal RCA  in 2/04.  Last Lexiscan myoview in 2011 showed EF 67%, normal.  12. Valvular heart disease: Last echo in 10/12 showed moderate to severe MR, moderate to severe TR.  13. Pulmonary hypertension: Suspect secondary to elevated left atrial pressure as well as probable reactive pulmonary vascular changes.  14. CKD 15. Squamous cell CA right leg  SH: Widow. Moved from [place] to [place] and living with her son.  Has 3 sons.  Former smoker (quit years ago).  Rare ETOH.   FH: Mother with CHF at 3, father with multiple myeloma.   Current Outpatient Prescriptions  Medication Sig Dispense Refill  . bisoprolol (ZEBETA) 10 MG tablet TAKE 1 TABLET (10 MG TOTAL) BY MOUTH DAILY.  90 tablet  3  . Calcium Carbonate-Vitamin D (CALCIUM 500 + D PO) Take by mouth daily.      . Coenzyme Q10 (COQ10 PO) Take 400 mg by mouth.      . furosemide (LASIX) 40 MG tablet 2 tablets (total 56m) two times a day  120 tablet  3  . isosorbide mononitrate (IMDUR) 30 MG 24 hr tablet TAKE ONE TABLET DAILY      . levothyroxine (SYNTHROID, LEVOTHROID) 50 MCG tablet Take 50 mcg by mouth daily before breakfast.      . lisinopril (PRINIVIL,ZESTRIL) 20 MG tablet TAKE 1 TABLET BY MOUTH TWICE DAILY  180 tablet  1  . lovastatin (MEVACOR) 20 MG tablet Take 20 mg by mouth at bedtime.      . metolazone (ZAROXOLYN) 2.5 MG tablet Weekly as directed 30 minutes before lasix (furosemide)      . NON FORMULARY 3 L daily. 3 Liters of oxygen      . potassium chloride (KLOR-CON) 20 MEQ packet Take 20 mEq by mouth daily.  30 packet  6  . warfarin (COUMADIN) 1 MG tablet Take 1-1.5 mg by mouth daily. Take 1 tablet on Sunday,Wednesday, & Friday Take 1.5 tablets on all other days       No current facility-administered medications for this visit.    BP 110/60  Pulse 69  Ht 4' 8" (1.422 m)  Wt 40.824 kg (90 lb)  BMI 20.19 kg/m2 General: frail, NAD Neck: JVP 8 cm, no thyromegaly or thyroid nodule.  Lungs: Slight bilateral basilar  crackles CV: Nondisplaced PMI.  Heart irregular S1/S2, no S3/S4, 3/6 HSM LLSB/apex.  No edema.  No carotid bruit.   Abdomen: Soft, nontender, no hepatosplenomegaly, no distention.     Neurologic: Alert and oriented x 3.  Psych: Normal affect. Extremities: No clubbing or cyanosis.   Assessment/Plan:  Atrial fibrillation  Chronic atrial fibrillation.  Continue warfarin. No recent falls.  She is tolerating bisoprolol with good rate control.   CAD  Stable with no chest pain. Continue warfarin (no indication to add ASA), statin, beta blocker, ACEI.  Diastolic CHF, chronic  Volume status stable.  Weight stable on home scales.   - Continue Lasix 80 mg bid.  - She may take 1 dose of metolazone 2.5 mg if weight rises > 90 lbs.   - BMET today.   Pulmonary hypertension  Probably secondary to diastolic LV failure with elevated LA pressure and reactive pulmonary artery changes.  Valvular heart disease  Moderate to severe MR and TR, likely significant contributors to diastolic CHF and pulmonary HTN. Not an operative candidate.  Hypertension BP is well-controlled.     Followup in 3 months    [name] 04/30/2013

## 2013-04-30 NOTE — Patient Instructions (Addendum)
Your physician recommends that you schedule a follow-up appointment in:  3 months. --Scheduled for Jul 30, 2013 at 1:45

## 2013-05-02 ENCOUNTER — Other Ambulatory Visit: Payer: Self-pay | Admitting: Cardiology

## 2013-05-11 ENCOUNTER — Telehealth: Payer: Self-pay | Admitting: Cardiology

## 2013-05-11 NOTE — Telephone Encounter (Signed)
New problem     Pt's daughter called to re port.  Fri. - Sat pt took wt. 90 lb. Pt took Metolazone 2.5 mg and Today 86 lb pt will continue to keep track of her weight.

## 2013-05-15 ENCOUNTER — Other Ambulatory Visit: Payer: Self-pay | Admitting: Cardiology

## 2013-05-21 ENCOUNTER — Ambulatory Visit (INDEPENDENT_AMBULATORY_CARE_PROVIDER_SITE_OTHER): Payer: Medicare Other

## 2013-05-21 DIAGNOSIS — Z7901 Long term (current) use of anticoagulants: Secondary | ICD-10-CM

## 2013-05-21 DIAGNOSIS — Z5181 Encounter for therapeutic drug level monitoring: Secondary | ICD-10-CM

## 2013-05-21 DIAGNOSIS — I4891 Unspecified atrial fibrillation: Secondary | ICD-10-CM

## 2013-05-21 LAB — POCT INR: INR: 1.9

## 2013-06-05 ENCOUNTER — Telehealth: Payer: Self-pay | Admitting: Cardiology

## 2013-06-05 DIAGNOSIS — N289 Disorder of kidney and ureter, unspecified: Secondary | ICD-10-CM

## 2013-06-05 DIAGNOSIS — I2510 Atherosclerotic heart disease of native coronary artery without angina pectoris: Secondary | ICD-10-CM

## 2013-06-05 NOTE — Telephone Encounter (Signed)
[name]) called to advise [name] that Janice Petersen wt was up to 90 lbs this AM and she took her Lasix and Metolazone.  States she is a little SOB but uses O2.  No edema in ankles. Janice Petersen states she is very independent and manages her own medications. She only takes the Metolazone when her wt is 90 lbs.  The last time she took Metolazone was 3/2.  Her last BMET was 04/30/13.  She was concerned about her kidneys.  Advised will send message to Dr. Aundra Dubin to see if he wants lab done in near future.  Next app w/Dr. Aundra Dubin is 07/30/13.

## 2013-06-05 NOTE — Telephone Encounter (Signed)
New message           Pt daughter in law says pt is weighing 90 pounds. Pt is taking 80mg  of lasix and has taken medication for the swelling.

## 2013-06-07 NOTE — Telephone Encounter (Signed)
Have her come in for BMET this week.

## 2013-06-08 NOTE — Telephone Encounter (Signed)
lmtcb Debbie Bita Cartwright RN  

## 2013-06-08 NOTE — Telephone Encounter (Signed)
Patient will be coming in on Thursday for both BMET and Coumadin clinic - 2:00 pm.  States today's weight is 87.5 lbs and denies any swelling/edema noted.

## 2013-06-11 ENCOUNTER — Other Ambulatory Visit (INDEPENDENT_AMBULATORY_CARE_PROVIDER_SITE_OTHER): Payer: Medicare Other

## 2013-06-11 ENCOUNTER — Ambulatory Visit (INDEPENDENT_AMBULATORY_CARE_PROVIDER_SITE_OTHER): Payer: Medicare Other | Admitting: Pharmacist Clinician (PhC)/ Clinical Pharmacy Specialist

## 2013-06-11 DIAGNOSIS — Z7901 Long term (current) use of anticoagulants: Secondary | ICD-10-CM

## 2013-06-11 DIAGNOSIS — I2510 Atherosclerotic heart disease of native coronary artery without angina pectoris: Secondary | ICD-10-CM

## 2013-06-11 DIAGNOSIS — Z5181 Encounter for therapeutic drug level monitoring: Secondary | ICD-10-CM

## 2013-06-11 DIAGNOSIS — I4891 Unspecified atrial fibrillation: Secondary | ICD-10-CM

## 2013-06-11 LAB — BASIC METABOLIC PANEL
BUN: 104 mg/dL — AB (ref 6–23)
CALCIUM: 9.6 mg/dL (ref 8.4–10.5)
CHLORIDE: 89 meq/L — AB (ref 96–112)
CO2: 29 meq/L (ref 19–32)
Creatinine, Ser: 2.3 mg/dL — ABNORMAL HIGH (ref 0.4–1.2)
GFR: 20.68 mL/min — ABNORMAL LOW (ref >60.00)
Glucose, Bld: 139 mg/dL — ABNORMAL HIGH (ref 70–99)
Potassium: 3.8 mEq/L (ref 3.5–5.1)
Sodium: 134 mEq/L — ABNORMAL LOW (ref 135–145)

## 2013-06-11 LAB — POCT INR: INR: 2.4

## 2013-06-12 NOTE — Telephone Encounter (Signed)
Message copied by Earvin Hansen on Fri Jun 12, 2013  1:37 PM ------      Message from: Larey Dresser      Created: Fri Jun 12, 2013  8:10 AM       No more metolazone.  Stop lisinopril for now.  Check BP off lisinopril daily and call with readings in 1 week.  Hold Lasix x 3 days then resume same dose Lasix.  BMET on Tuesday.  Followup with me in CHF clinic on a day that I am there next week. ------

## 2013-06-12 NOTE — Telephone Encounter (Signed)
Ulla Potash of medication changes and lab appointment next week. Called CHF clinic to schedule and answering service answered. Will forward to Connye Burkitt in [facility] to schedule.

## 2013-06-16 ENCOUNTER — Ambulatory Visit (HOSPITAL_COMMUNITY)
Admission: RE | Admit: 2013-06-16 | Discharge: 2013-06-16 | Disposition: A | Discharge status: Home / Self Care | Payer: Medicare Other | Source: Ambulatory Visit | Attending: Cardiology | Admitting: Cardiology

## 2013-06-16 ENCOUNTER — Encounter (HOSPITAL_COMMUNITY): Payer: Self-pay

## 2013-06-16 ENCOUNTER — Other Ambulatory Visit: Payer: Medicare Other

## 2013-06-16 VITALS — Wt 91.8 lb

## 2013-06-16 DIAGNOSIS — I4891 Unspecified atrial fibrillation: Secondary | ICD-10-CM

## 2013-06-16 DIAGNOSIS — N189 Chronic kidney disease, unspecified: Secondary | ICD-10-CM

## 2013-06-16 DIAGNOSIS — I5032 Chronic diastolic (congestive) heart failure: Secondary | ICD-10-CM

## 2013-06-16 DIAGNOSIS — I38 Endocarditis, valve unspecified: Secondary | ICD-10-CM

## 2013-06-16 DIAGNOSIS — I509 Heart failure, unspecified: Secondary | ICD-10-CM

## 2013-06-16 LAB — BASIC METABOLIC PANEL
BUN: 100 mg/dL — ABNORMAL HIGH (ref 6–23)
CO2: 27 mEq/L (ref 19–32)
CREATININE: 1.84 mg/dL — AB (ref 0.50–1.10)
Calcium: 9.6 mg/dL (ref 8.4–10.5)
Chloride: 99 mEq/L (ref 96–112)
GFR, EST AFRICAN AMERICAN: 26 mL/min — AB (ref >90)
GFR, EST NON AFRICAN AMERICAN: 22 mL/min — AB (ref >90)
Glucose, Bld: 145 mg/dL — ABNORMAL HIGH (ref 70–99)
Potassium: 4 mEq/L (ref 3.7–5.3)
Sodium: 144 mEq/L (ref 137–147)

## 2013-06-16 NOTE — Patient Instructions (Signed)
Labs today.  STOP Metolazone STOP Lisinopril  Your physician recommends that you schedule a follow-up appointment in: 6 weeks  Do the following things EVERYDAY: 1) Weigh yourself in the morning before breakfast. Write it down and keep it in a log. 2) Take your medicines as prescribed 3) Eat low salt foods-Limit salt (sodium) to 2000 mg per day.  4) Stay as active as you can everyday 5) Limit all fluids for the day to less than 2 liters 6)

## 2013-06-17 ENCOUNTER — Other Ambulatory Visit (HOSPITAL_COMMUNITY): Payer: Self-pay

## 2013-06-17 ENCOUNTER — Telehealth (HOSPITAL_COMMUNITY): Payer: Self-pay

## 2013-06-17 DIAGNOSIS — I509 Heart failure, unspecified: Secondary | ICD-10-CM

## 2013-06-17 DIAGNOSIS — R0602 Shortness of breath: Secondary | ICD-10-CM

## 2013-06-17 DIAGNOSIS — N189 Chronic kidney disease, unspecified: Secondary | ICD-10-CM | POA: Insufficient documentation

## 2013-06-17 DIAGNOSIS — I4891 Unspecified atrial fibrillation: Secondary | ICD-10-CM

## 2013-06-17 MED ORDER — FUROSEMIDE 40 MG PO TABS: ORAL_TABLET | ORAL | Status: DC

## 2013-06-17 NOTE — Progress Notes (Signed)
Patient ID: Janice Petersen, female   DOB: 08-Oct-1915, 80 y.o.   MRN: [id number] PCP: Stephanie Acre  26 yo with history of diastolic CHF, valvular heart disease, CAD, and chronic atrial fibrillation presents for cardiology followup.  She used to live in [facility] and was followed by a cardiologist down there.  She has moved to [facility] to be nearer her family (currently living with one of her sons).  She had a stent placed in her RCA in 2004.  She has had a long history of valvular disease, last echo showed moderate to severe MR and moderate to severe TR with severe pulmonary hypertension.  She has a history of diastolic CHF.  She has been in atrial fibrillation since 2003 on warfarin.  Symptomatically, [name] has been relatively stable.  She can walk in her house without dyspnea.  She has a lift to get up the steps now.  No orthopnea/PND.  No lightheadedness or syncope.  No falls.  She gets out to go to church.  Her appetite has been poor and she has not been eating much. Over the last month, she has taken metolazone 2-3 times for weight rise.  Earlier this month, she had a BMET showing BUN 104/creatinine 2.3.  I had her stop lisinopril and metolazone altogether and had her hold Lasix for 3 days.  She restarted Lasix yesterday.    Labs (2/13): K 4.1, creatinine 1.1 => 1.4, proBNP 683, LDL 54, HDL 44 Labs (4/13): Creatinine 1.87, K 4.9, BNP 486 Labs (6/13): K 3.6, creatinine 1.4 Labs (2/14): K 3.8, creatinine 1.3, BNP 446 Labs (6/14): K 3.6, creatinine 1.4, BNP 733 Labs (8/14): K 3.8, creatinine 1.3, BNP 721, LDL 49, HDl 52 Labs (11/14): K 4.4, creatinine 1.7 => 2.4 => 1.5, BNP 549 Labs (12/14): K 4.3, creatinine 1.5 Labs (4/15): K 3.8, BUN 104, creatinine 2.3  PMH: 1. Diastolic CHF: Echo (82/95) with EF 65%, mild AI, moderate to severe MR, moderate to severe TR, PA systolic pressure 84 mmHg.  She has been on home oxygen since 1/13.  2. Chronic atrial fibrillation since 2003. 3. PMR 4. H/o  appendectomy 5. TAH 6. Renal artery stenosis: 50% left renal artery stenosis found on angiogram in 8/02.  7. Hypothyroidism 8. MRSA sepsis in 2006 9. Hyperlipidemia 10. H/o PE 11. CAD: Cypher DES to proximal RCA in 2/04.  Last Lexiscan myoview in 2011 showed EF 67%, normal.  12. Valvular heart disease: Last echo in 10/12 showed moderate to severe MR, moderate to severe TR.  13. Pulmonary hypertension: Suspect secondary to elevated left atrial pressure as well as probable reactive pulmonary vascular changes.  14. CKD 15. Squamous cell CA right leg  SH: Widow. Moved from [place] to [place] and living with her son.  Has 3 sons.  Former smoker (quit years ago).  Rare ETOH.   FH: Mother with CHF at 57, father with multiple myeloma.   ROS: All systems reviewed and negative except as per HPI.   Current Outpatient Prescriptions  Medication Sig Dispense Refill  . bisoprolol (ZEBETA) 10 MG tablet TAKE 1 TABLET (10 MG TOTAL) BY MOUTH DAILY.  90 tablet  3  . Calcium Carbonate-Vitamin D (CALCIUM 500 + D PO) Take by mouth daily.      . Coenzyme Q10 (COQ10 PO) Take 400 mg by mouth.      . furosemide (LASIX) 40 MG tablet 2 tablets (total 75m) two times a day  120 tablet  3  . isosorbide mononitrate (  IMDUR) 30 MG 24 hr tablet TAKE ONE TABLET DAILY      . KLOR-CON M20 20 MEQ tablet TAKE 1 TABLET BY MOUTH EVERY DAY  30 tablet  0  . levothyroxine (SYNTHROID, LEVOTHROID) 50 MCG tablet Take 50 mcg by mouth daily before breakfast.      . lovastatin (MEVACOR) 20 MG tablet TAKE 1 TABLET BY MOUTH EVERY DAY  90 tablet  0  . NON FORMULARY 3 L daily. 3 Liters of oxygen      . warfarin (COUMADIN) 1 MG tablet TAKE AS DIRECTED BY ANTICOAGULATION CLINIC  60 tablet  3   No current facility-administered medications for this encounter.    Wt 91 lb 12.8 oz (41.64 kg) General: frail, NAD Neck: JVP 10 cm, no thyromegaly or thyroid nodule.  Lungs: Slight bilateral basilar crackles CV: Nondisplaced PMI.   Heart irregular S1/S2, no S3/S4, 3/6 HSM LLSB/apex.  No edema.  No carotid bruit.   Abdomen: Soft, nontender, no hepatosplenomegaly, no distention.     Neurologic: Alert and oriented x 3.  Psych: Normal affect. Extremities: No clubbing or cyanosis.   Assessment/Plan:  Atrial fibrillation  Chronic atrial fibrillation.  Continue warfarin. No recent falls.  She is tolerating bisoprolol with good rate control.   CAD  Stable with no chest pain. Continue warfarin (no indication to add ASA), statin, beta blocker. Diastolic CHF, chronic  Probably mildly volume overloaded, has been off Lasix x 3 days.  Weight is stable at 91.    - Restart Lasix at 80 qam, 40 qpm.  - No more metolazone.   - BMET today.   Pulmonary hypertension  Probably secondary to diastolic LV failure with elevated LA pressure and reactive pulmonary artery changes.  Valvular heart disease  Moderate to severe MR and TR, likely significant contributors to diastolic CHF and pulmonary HTN. Not an operative candidate.  Hypertension I am going to keep her off lisinopril.  BP has not been elevated off lisinopril.     Followup in 6 wks.    Larey Dresser 06/17/2013

## 2013-06-17 NOTE — Telephone Encounter (Signed)
Patient's daughter in law contacted regarding change in medication.  Instructed to have patient take lasix 80mg  in am and 40mg  in pm starting today.  Also scheduled patient to have labs drawn 06/23/13.  Juliann Pulse aware and agreeable. Renee Pain

## 2013-06-23 ENCOUNTER — Ambulatory Visit (HOSPITAL_COMMUNITY)
Admission: RE | Admit: 2013-06-23 | Discharge: 2013-06-23 | Disposition: A | Discharge status: Home / Self Care | Payer: Medicare Other | Source: Ambulatory Visit | Attending: Internal Medicine | Admitting: Internal Medicine

## 2013-06-23 DIAGNOSIS — I5022 Chronic systolic (congestive) heart failure: Secondary | ICD-10-CM

## 2013-06-23 DIAGNOSIS — N189 Chronic kidney disease, unspecified: Secondary | ICD-10-CM

## 2013-06-23 LAB — BASIC METABOLIC PANEL
BUN: 76 mg/dL — ABNORMAL HIGH (ref 6–23)
CALCIUM: 9.6 mg/dL (ref 8.4–10.5)
CO2: 27 mEq/L (ref 19–32)
CREATININE: 1.57 mg/dL — AB (ref 0.50–1.10)
Chloride: 98 mEq/L (ref 96–112)
GFR, EST AFRICAN AMERICAN: 31 mL/min — AB (ref >90)
GFR, EST NON AFRICAN AMERICAN: 27 mL/min — AB (ref >90)
Glucose, Bld: 169 mg/dL — ABNORMAL HIGH (ref 70–99)
Potassium: 4.2 mEq/L (ref 3.7–5.3)
Sodium: 142 mEq/L (ref 137–147)

## 2013-06-23 LAB — PRO B NATRIURETIC PEPTIDE: PRO B NATRI PEPTIDE: 5119 pg/mL — AB (ref 0–450)

## 2013-06-23 NOTE — Progress Notes (Signed)
Patient came in for BMET and P-BNP lab draws.  Says since her emtolazone has been [place] she has gained 3-4 lbs.  No complaints of SOB, swelling, cough, trouble sleeping, no edema noted.  Per Amy Clegg NP-C, intructed to take extra 40mg  furosemide this afternoon and call us with any other issues, and/or if weight continues to trend upward.  Aware and appreciative. Renee Pain

## 2013-06-25 ENCOUNTER — Telehealth (HOSPITAL_COMMUNITY): Payer: Self-pay | Admitting: Cardiology

## 2013-06-25 NOTE — Telephone Encounter (Signed)
Spoke w/pt's daughter, advised cr better, she states pt's wt has been up to 90 lb for past 3 days, he usually runs about 85 lbs, also pt has increased SOB with activity, reviewed with Darrick Grinder, NP will have pt take 1 dose of metolazone and call us back if wt and SOB not improved pt's daughter aware and agreeable

## 2013-06-25 NOTE — Telephone Encounter (Signed)
Pt called to request lab results. Pt states she is unsure which dose of diuretics she should now be on  Please advise

## 2013-06-29 NOTE — Telephone Encounter (Signed)
Visit scheduled for 06/16/2013

## 2013-07-06 ENCOUNTER — Other Ambulatory Visit: Payer: Self-pay | Admitting: Cardiology

## 2013-07-09 ENCOUNTER — Ambulatory Visit (INDEPENDENT_AMBULATORY_CARE_PROVIDER_SITE_OTHER): Payer: Medicare Other | Admitting: Pharmacist

## 2013-07-09 ENCOUNTER — Telehealth: Payer: Self-pay | Admitting: Pharmacist

## 2013-07-09 DIAGNOSIS — Z5181 Encounter for therapeutic drug level monitoring: Secondary | ICD-10-CM

## 2013-07-09 DIAGNOSIS — Z7901 Long term (current) use of anticoagulants: Secondary | ICD-10-CM

## 2013-07-09 DIAGNOSIS — I4891 Unspecified atrial fibrillation: Secondary | ICD-10-CM

## 2013-07-09 LAB — POCT INR: INR: 2.5

## 2013-07-09 NOTE — Telephone Encounter (Signed)
Spoke w/pt's daughter in law, she states pt is more SOB than usual, she will give Metolazone in AM and let us know if SOB or wt does not improve

## 2013-07-09 NOTE — Telephone Encounter (Signed)
If she is more short of breath, may take metolazone 2.5 mg x 1 tomorrow morning before am diuretics.  If she is not more short of breath, would wait a couple of days and use it if weight continues to trend up.

## 2013-07-09 NOTE — Telephone Encounter (Signed)
Dr. Aundra Dubin, Patient informed us that he weight is now up to 90 lbs again.  She wants to know if she should take her metolazone or not.  She was told to not take it again unless you advised her to do so due to kidney function.  Please let patient know.  Phone number [phone]

## 2013-07-23 ENCOUNTER — Telehealth: Payer: Self-pay | Admitting: Cardiology

## 2013-07-23 ENCOUNTER — Other Ambulatory Visit: Payer: Self-pay | Admitting: *Deleted

## 2013-07-23 ENCOUNTER — Encounter: Payer: Self-pay | Admitting: *Deleted

## 2013-07-23 DIAGNOSIS — R0602 Shortness of breath: Secondary | ICD-10-CM

## 2013-07-23 DIAGNOSIS — I4891 Unspecified atrial fibrillation: Secondary | ICD-10-CM

## 2013-07-23 MED ORDER — FUROSEMIDE 40 MG PO TABS: 80.0000 mg | ORAL_TABLET | Freq: Two times a day (BID) | ORAL | Status: DC

## 2013-07-23 NOTE — Telephone Encounter (Signed)
She can take a dose of metolazone but let's also increase Lasix to 80 mg bid.  Let's keep her followup at CHF clinic if that is ok with her (rather than [employer]).

## 2013-07-23 NOTE — Telephone Encounter (Signed)
Spoke w/pt she is aware and agreeable, she is being seen in [facility] office along with cvrr appt 5/21 so will leave that appt and further appt will be with chf clinic

## 2013-07-23 NOTE — Telephone Encounter (Signed)
LMTCB and routed message to Kevan Rosebush, RN, Heart Failure Clinic.

## 2013-07-23 NOTE — Telephone Encounter (Signed)
New problem   Pt has water gain and need to speak to nurse concerning this.

## 2013-07-23 NOTE — Telephone Encounter (Signed)
Spoke w/pt's daughter she states pt's wt is back up to 90 lbs today, she does have a slight increase in SOB with exertion, no edema.  Lasix was decreased to 80 in AM and 40 in PM on 4/7 due to kidney function which has since improved, pt has been instructed to take Metolazone on 4/16 and again on 4/30 for wt up to 90 lbs, daughter states that use to really get the weight down and improve SOB but now wt has only been going to about 87 lbs instead of 85 lb, will send to Dr Aundra Dubin to review, she is sch to see him in the [facility] office on next [facility] 5/21

## 2013-07-28 ENCOUNTER — Other Ambulatory Visit: Payer: Self-pay | Admitting: Cardiology

## 2013-07-28 ENCOUNTER — Encounter (HOSPITAL_COMMUNITY): Payer: Medicare Other

## 2013-07-30 ENCOUNTER — Encounter: Payer: Self-pay | Admitting: Cardiology

## 2013-07-30 ENCOUNTER — Ambulatory Visit (INDEPENDENT_AMBULATORY_CARE_PROVIDER_SITE_OTHER): Payer: Medicare Other | Admitting: Cardiology

## 2013-07-30 ENCOUNTER — Ambulatory Visit (INDEPENDENT_AMBULATORY_CARE_PROVIDER_SITE_OTHER): Payer: Medicare Other | Admitting: Pharmacist

## 2013-07-30 VITALS — BP 124/67 | HR 87 | Ht <= 58 in | Wt 92.0 lb

## 2013-07-30 DIAGNOSIS — Z5181 Encounter for therapeutic drug level monitoring: Secondary | ICD-10-CM

## 2013-07-30 DIAGNOSIS — R0602 Shortness of breath: Secondary | ICD-10-CM

## 2013-07-30 DIAGNOSIS — I2510 Atherosclerotic heart disease of native coronary artery without angina pectoris: Secondary | ICD-10-CM

## 2013-07-30 DIAGNOSIS — I38 Endocarditis, valve unspecified: Secondary | ICD-10-CM

## 2013-07-30 DIAGNOSIS — I4891 Unspecified atrial fibrillation: Secondary | ICD-10-CM

## 2013-07-30 DIAGNOSIS — N189 Chronic kidney disease, unspecified: Secondary | ICD-10-CM

## 2013-07-30 DIAGNOSIS — Z7901 Long term (current) use of anticoagulants: Secondary | ICD-10-CM

## 2013-07-30 DIAGNOSIS — I509 Heart failure, unspecified: Secondary | ICD-10-CM

## 2013-07-30 DIAGNOSIS — I5032 Chronic diastolic (congestive) heart failure: Secondary | ICD-10-CM

## 2013-07-30 LAB — BRAIN NATRIURETIC PEPTIDE: PRO B NATRI PEPTIDE: 298 pg/mL — AB (ref 0.0–100.0)

## 2013-07-30 LAB — BASIC METABOLIC PANEL
BUN: 72 mg/dL — AB (ref 6–23)
CALCIUM: 9.2 mg/dL (ref 8.4–10.5)
CO2: 33 mEq/L — ABNORMAL HIGH (ref 19–32)
CREATININE: 1.5 mg/dL — AB (ref 0.4–1.2)
Chloride: 95 mEq/L — ABNORMAL LOW (ref 96–112)
GFR: 34.2 mL/min — AB (ref >60.00)
Glucose, Bld: 125 mg/dL — ABNORMAL HIGH (ref 70–99)
Potassium: 3.3 mEq/L — ABNORMAL LOW (ref 3.5–5.1)
Sodium: 137 mEq/L (ref 135–145)

## 2013-07-30 LAB — POCT INR: INR: 2.8

## 2013-07-30 MED ORDER — METOLAZONE 2.5 MG PO TABS: ORAL_TABLET | ORAL | Status: DC

## 2013-07-30 NOTE — Patient Instructions (Signed)
Take metolazone 2.5mg  if your weight on your home scales is more than 90 pounds. Do not take this more than 1 time a week.  Your physician recommends that you have  lab work today--BMET/BNP  Your physician recommends that you schedule a follow-up appointment in: 6 weeks with Dr Aundra Dubin in the [facility] at [facility].

## 2013-07-31 ENCOUNTER — Other Ambulatory Visit: Payer: Self-pay | Admitting: *Deleted

## 2013-07-31 MED ORDER — POTASSIUM CHLORIDE CRYS ER 20 MEQ PO TBCR: 20.0000 meq | EXTENDED_RELEASE_TABLET | Freq: Two times a day (BID) | ORAL | Status: DC

## 2013-07-31 NOTE — Progress Notes (Signed)
Patient ID: Janice Petersen, female   DOB: [birth year], 80 y.o.   MRN: [id number] PCP: Stephanie Acre  72 yo with history of diastolic CHF, valvular heart disease, CAD, and chronic atrial fibrillation presents for cardiology followup.  She used to live in [facility] and was followed by a cardiologist down there.  She has moved to [facility] to be nearer her family (currently living with one of her sons).  She had a stent placed in her RCA in 2004.  She has had a long history of valvular disease, last echo showed moderate to severe MR and moderate to severe TR with severe pulmonary hypertension.  She has a history of diastolic CHF.  She has been in atrial fibrillation since 2003 on warfarin.  Symptomatically, Janice Petersen has been relatively stable.  She can walk in her house without dyspnea.  She has a lift to get up the steps now.  No orthopnea/PND.  No lightheadedness or syncope.  No falls.  She gets out to go to church.  Her appetite has been poor and she has not been eating much. She took metolazone on Saturday.  Creatinine was better at last check.  Labs (2/13): K 4.1, creatinine 1.1 => 1.4, proBNP 683, LDL 54, HDL 44 Labs (4/13): Creatinine 1.87, K 4.9, BNP 486 Labs (6/13): K 3.6, creatinine 1.4 Labs (2/14): K 3.8, creatinine 1.3, BNP 446 Labs (6/14): K 3.6, creatinine 1.4, BNP 733 Labs (8/14): K 3.8, creatinine 1.3, BNP 721, LDL 49, HDl 52 Labs (11/14): K 4.4, creatinine 1.7 => 2.4 => 1.5, BNP 549 Labs (12/14): K 4.3, creatinine 1.5 Labs (4/15): K 3.8, BUN 104, creatinine 2.3 Labs (4/15): K 4.2, BUN 76, creatinine 1.57, pro-BNP 5119  ECG: atypical atrial flutter rate 73, iRBBB, borderline LVH  PMH: 1. Diastolic CHF: Echo (42/35) with EF 65%, mild AI, moderate to severe MR, moderate to severe TR, PA systolic pressure 84 mmHg.  She has been on home oxygen since 1/13.  2. Chronic atrial fibrillation since 2003. 3. PMR 4. H/o appendectomy 5. TAH 6. Renal artery stenosis: 50% left renal artery stenosis  found on angiogram in 8/02.  7. Hypothyroidism 8. MRSA sepsis in 2006 9. Hyperlipidemia 10. H/o PE 11. CAD: Cypher DES to proximal RCA in 2/04.  Last Lexiscan myoview in 2011 showed EF 67%, normal.  12. Valvular heart disease: Last echo in 10/12 showed moderate to severe MR, moderate to severe TR.  13. Pulmonary hypertension: Suspect secondary to elevated left atrial pressure as well as probable reactive pulmonary vascular changes.  14. CKD 15. Squamous cell CA right leg  SH: Widow. Moved from [place] to [place] and living with her son.  Has 3 sons.  Former smoker (quit years ago).  Rare ETOH.   FH: Mother with CHF at 54, father with multiple myeloma.   ROS: All systems reviewed and negative except as per HPI.   Current Outpatient Prescriptions  Medication Sig Dispense Refill  . bisoprolol (ZEBETA) 10 MG tablet TAKE 1 TABLET (10 MG TOTAL) BY MOUTH DAILY.  90 tablet  3  . Calcium Carbonate-Vitamin D (CALCIUM 500 + D PO) Take by mouth daily.      . Coenzyme Q10 (COQ10 PO) Take 400 mg by mouth.      . furosemide (LASIX) 40 MG tablet Take 2 tablets (80 mg total) by mouth 2 (two) times daily.  120 tablet  3  . isosorbide mononitrate (IMDUR) 30 MG 24 hr tablet TAKE ONE TABLET DAILY      .  KLOR-CON M20 20 MEQ tablet TAKE 1 TABLET BY MOUTH EVERY DAY  30 tablet  0  . levothyroxine (SYNTHROID, LEVOTHROID) 50 MCG tablet Take 50 mcg by mouth daily before breakfast.      . lovastatin (MEVACOR) 20 MG tablet TAKE 1 TABLET BY MOUTH EVERY DAY  90 tablet  0  . NON FORMULARY 3 L daily. 3 Liters of oxygen      . Probiotic Product (ALIGN PO) Take by mouth daily.      Marland Kitchen warfarin (COUMADIN) 1 MG tablet TAKE AS DIRECTED BY ANTICOAGULATION CLINIC  60 tablet  3  . metolazone (ZAROXOLYN) 2.5 MG tablet Take 1 tablet if your weight is more than 90 pounds on your home scales. Do not take more than 1 time a week.  15 tablet  1   No current facility-administered medications for this visit.    BP 124/67   Pulse 87  Ht _0  (1.422 m)  Wt 41.731 kg (92 lb)  BMI 20.64 kg/m2 General: frail, NAD Neck: JVP 10 cm, no thyromegaly or thyroid nodule.  Lungs: Slight bilateral basilar crackles CV: Nondisplaced PMI.  Heart irregular S1/S2, no S3/S4, 3/6 HSM LLSB/apex.  No edema.  No carotid bruit.   Abdomen: Soft, nontender, no hepatosplenomegaly, no distention.     Neurologic: Alert and oriented x 3.  Psych: Normal affect. Extremities: No clubbing or cyanosis.   Assessment/Plan:  Atrial fibrillation  Chronic atrial fibrillation.  Continue warfarin. No recent falls.  She is tolerating bisoprolol with good rate control.   CAD  Stable with no chest pain. Continue warfarin (no indication to add ASA), statin, beta blocker. Diastolic CHF, chronic  Mild volume overload, weight stable on our scales.    - Continue lasix 80 mg bid.  - Metolazone 2.5 mg x 1 30 minutes before am Lasix if weight is > 90 lbs on her home scales.  She will not take it more than once a week without calling us.    - BMET/BNP today.   - Followup in 1 month.  Pulmonary hypertension  Probably secondary to diastolic LV failure with elevated LA pressure and reactive pulmonary artery changes.  Valvular heart disease  Moderate to severe MR and TR, likely significant contributors to diastolic CHF and pulmonary HTN. Not an operative candidate.  Hypertension I am going to keep her off lisinopril.  BP has not been elevated off lisinopril.      Larey Dresser 07/31/2013

## 2013-08-06 ENCOUNTER — Other Ambulatory Visit: Payer: Self-pay | Admitting: Cardiology

## 2013-08-14 ENCOUNTER — Other Ambulatory Visit: Payer: Self-pay | Admitting: Cardiology

## 2013-08-27 ENCOUNTER — Ambulatory Visit (INDEPENDENT_AMBULATORY_CARE_PROVIDER_SITE_OTHER): Payer: Medicare Other | Admitting: *Deleted

## 2013-08-27 DIAGNOSIS — Z7901 Long term (current) use of anticoagulants: Secondary | ICD-10-CM

## 2013-08-27 DIAGNOSIS — I4891 Unspecified atrial fibrillation: Secondary | ICD-10-CM

## 2013-08-27 DIAGNOSIS — Z5181 Encounter for therapeutic drug level monitoring: Secondary | ICD-10-CM

## 2013-08-27 LAB — POCT INR: INR: 2.5

## 2013-09-08 ENCOUNTER — Encounter (HOSPITAL_COMMUNITY): Payer: Self-pay

## 2013-09-08 ENCOUNTER — Ambulatory Visit (HOSPITAL_COMMUNITY)
Admission: RE | Admit: 2013-09-08 | Discharge: 2013-09-08 | Disposition: A | Discharge status: Home / Self Care | Payer: Medicare Other | Source: Ambulatory Visit | Attending: Cardiology | Admitting: Cardiology

## 2013-09-08 VITALS — BP 107/63 | HR 93 | Resp 20 | Wt 92.8 lb

## 2013-09-08 DIAGNOSIS — I509 Heart failure, unspecified: Secondary | ICD-10-CM | POA: Diagnosis not present

## 2013-09-08 DIAGNOSIS — I5032 Chronic diastolic (congestive) heart failure: Secondary | ICD-10-CM | POA: Diagnosis not present

## 2013-09-08 LAB — BASIC METABOLIC PANEL
BUN: 82 mg/dL — ABNORMAL HIGH (ref 6–23)
CHLORIDE: 92 meq/L — AB (ref 96–112)
CO2: 30 meq/L (ref 19–32)
Calcium: 9.6 mg/dL (ref 8.4–10.5)
Creatinine, Ser: 1.41 mg/dL — ABNORMAL HIGH (ref 0.50–1.10)
GFR calc non Af Amer: 30 mL/min — ABNORMAL LOW (ref >90)
GFR, EST AFRICAN AMERICAN: 35 mL/min — AB (ref >90)
Glucose, Bld: 179 mg/dL — ABNORMAL HIGH (ref 70–99)
POTASSIUM: 3.1 meq/L — AB (ref 3.7–5.3)
Sodium: 138 mEq/L (ref 137–147)

## 2013-09-08 MED ORDER — METOLAZONE 2.5 MG PO TABS: ORAL_TABLET | ORAL | Status: DC

## 2013-09-08 NOTE — Progress Notes (Addendum)
Patient ID: Janice Petersen, female   DOB: [birth year], 80 y.o.   MRN: [id number] PCP: Janice Petersen  80 yo with history of diastolic CHF, valvular heart disease, CAD, and chronic atrial fibrillation presents for cardiology followup.  She used to live in [facility] and was followed by a cardiologist down there.  She has moved to [facility] to be nearer her family (currently living with one of her sons).  She had a stent placed in her RCA in 2004.  She has had a long history of valvular disease, last echo showed moderate to severe MR and moderate to severe TR with severe pulmonary hypertension.  She has a history of diastolic CHF.  She has been in atrial fibrillation since 2003 on warfarin.  She returns for follow up. Last visit she was given an extra metolazone and continued lasix 80 mg twice a day. Weight at home 89-92 pounds. She has continued to take metolazone once a week . Says her weight quickly goes back up. Breathing is ok and back to baseline. She continues to do sitercise about 3-4 times a week. Poor appetite.     Labs (2/13): K 4.1, creatinine 1.1 => 1.4, proBNP 683, LDL 54, HDL 44 Labs (4/13): Creatinine 1.87, K 4.9, BNP 486 Labs (6/13): K 3.6, creatinine 1.4 Labs (2/14): K 3.8, creatinine 1.3, BNP 446 Labs (6/14): K 3.6, creatinine 1.4, BNP 733 Labs (8/14): K 3.8, creatinine 1.3, BNP 721, LDL 49, HDl 52 Labs (11/14): K 4.4, creatinine 1.7 => 2.4 => 1.5, BNP 549 Labs (12/14): K 4.3, creatinine 1.5 Labs (4/15): K 3.8, BUN 104, creatinine 2.3 Labs (4/15): K 4.2, BUN 76, creatinine 1.57, pro-BNP 5119 Labs (07/30/13) K 3.3 Creatinine 1.5   ECG: atypical atrial flutter rate 73, iRBBB, borderline LVH  PMH: 1. Diastolic CHF: Echo (75/10) with EF 65%, mild AI, moderate to severe MR, moderate to severe TR, PA systolic pressure 84 mmHg.  She has been on home oxygen since 1/13.  2. Chronic atrial fibrillation since 2003. 3. PMR 4. H/o appendectomy 5. TAH 6. Renal artery stenosis: 50% left renal  artery stenosis found on angiogram in 8/02.  7. Hypothyroidism 8. MRSA sepsis in 2006 9. Hyperlipidemia 10. H/o PE 11. CAD: Cypher DES to proximal RCA in 2/04.  Last Lexiscan myoview in 2011 showed EF 67%, normal.  12. Valvular heart disease: Last echo in 10/12 showed moderate to severe MR, moderate to severe TR.  13. Pulmonary hypertension: Suspect secondary to elevated left atrial pressure as well as probable reactive pulmonary vascular changes.  14. CKD 15. Squamous cell CA right leg  SH: Widow. Moved from [place] to [place] and living with her son.  Has 3 sons.  Former smoker (quit years ago).  Rare ETOH.   FH: Mother with CHF at 78, father with multiple myeloma.   ROS: All systems reviewed and negative except as per HPI.   Current Outpatient Prescriptions  Medication Sig Dispense Refill  . bisoprolol (ZEBETA) 10 MG tablet TAKE 1 TABLET BY MOUTH EVERY DAY  90 tablet  0  . Calcium Carbonate-Vitamin D (CALCIUM 500 + D PO) Take by mouth daily.      . Coenzyme Q10 (COQ10 PO) Take 400 mg by mouth.      . furosemide (LASIX) 40 MG tablet Take 2 tablets (80 mg total) by mouth 2 (two) times daily.  120 tablet  3  . isosorbide mononitrate (IMDUR) 30 MG 24 hr tablet TAKE ONE TABLET DAILY      .  levothyroxine (SYNTHROID, LEVOTHROID) 50 MCG tablet Take 50 mcg by mouth daily before breakfast.      . lovastatin (MEVACOR) 20 MG tablet TAKE 1 TABLET BY MOUTH EVERY DAY  90 tablet  0  . NON FORMULARY 3 L daily. 3 Liters of oxygen      . potassium chloride SA (K-DUR,KLOR-CON) 20 MEQ tablet Take 1 tablet (20 mEq total) by mouth 2 (two) times daily.  60 tablet  3  . Probiotic Product (ALIGN PO) Take by mouth daily.      Marland Kitchen warfarin (COUMADIN) 1 MG tablet TAKE AS DIRECTED BY ANTICOAGULATION CLINIC  60 tablet  3  . metolazone (ZAROXOLYN) 2.5 MG tablet Take 2.5 mg by mouth. Take 1 tablet if your weight is more than 90 pounds on your home scales. Do not take more than 1 time a week.       No current  facility-administered medications for this encounter.    BP 107/63  Pulse 93  Resp 20  Wt 92 lb 12 oz (42.071 kg)  SpO2 90% General: frail, NAD arrived in a wheelchair.  Neck: JVP  To jaw , no thyromegaly or thyroid nodule.  Lungs: Slight bilateral basilar crackles CV: Nondisplaced PMI.  Heart irregular S1/S2, no S3/S4, 3/6 HSM LLSB/apex.  No edema.  No carotid bruit.   Abdomen: Soft, nontender, no hepatosplenomegaly, no distention.     Neurologic: Alert and oriented x 3.  Psych: Normal affect. Extremities: No clubbing or cyanosis. R and LLE 1+ edema  Assessment/Plan:  Atrial fibrillation  Chronic atrial fibrillation.  Continue warfarin. No recent falls.  She is tolerating bisoprolol with good rate control.   CAD  Stable with no chest pain. Continue warfarin (no indication to add ASA), statin, beta blocker. Diastolic CHF, chronic  Mild volume overload noted today. Continue lasix 80 mg bid.  - Increase Metolazone 2.5 mg twice a week and hold if weight is < 87 pounds.  - BMET/BNP today.   Follow up in 1 month    Janice Petersen,Janice Petersen  09/08/2013  Patient seen and examined with Janice Grinder, NP. We discussed all aspects of the encounter. I agree with the assessment and plan as stated above. She is volume overloaded. Agree with increasing metolazone. Watch renal function.   Janice Bensimhon,MD 3:15 PM

## 2013-09-08 NOTE — Patient Instructions (Signed)
Follow up in 1 month with Dr Aundra Dubin  Take metolazone 2.5 mg twice a week. Hold metolazone if your weight is < 88 pounds   Do the following things EVERYDAY: 1) Weigh yourself in the morning before breakfast. Write it down and keep it in a log. 2) Take your medicines as prescribed 3) Eat low salt foods-Limit salt (sodium) to 2000 mg per day.  4) Stay as active as you can everyday 5) Limit all fluids for the day to less than 2 liters

## 2013-09-09 ENCOUNTER — Telehealth (HOSPITAL_COMMUNITY): Payer: Self-pay

## 2013-09-09 MED ORDER — POTASSIUM CHLORIDE CRYS ER 20 MEQ PO TBCR: 40.0000 meq | EXTENDED_RELEASE_TABLET | Freq: Two times a day (BID) | ORAL | Status: DC

## 2013-09-09 NOTE — Telephone Encounter (Signed)
Lab results reviewed with patient's son.  Asked to increase potassium to 40 meq twice daily.  Aware and agreeable, Rx sent to preferred pharmacy electronically. Renee Pain

## 2013-09-17 ENCOUNTER — Other Ambulatory Visit: Payer: Self-pay | Admitting: Cardiology

## 2013-09-24 NOTE — Telephone Encounter (Signed)
Close encounter 

## 2013-10-01 ENCOUNTER — Ambulatory Visit (INDEPENDENT_AMBULATORY_CARE_PROVIDER_SITE_OTHER): Payer: Medicare Other | Admitting: *Deleted

## 2013-10-01 DIAGNOSIS — Z5181 Encounter for therapeutic drug level monitoring: Secondary | ICD-10-CM

## 2013-10-01 DIAGNOSIS — Z7901 Long term (current) use of anticoagulants: Secondary | ICD-10-CM

## 2013-10-01 DIAGNOSIS — I4891 Unspecified atrial fibrillation: Secondary | ICD-10-CM

## 2013-10-01 LAB — POCT INR: INR: 2.5

## 2013-10-07 ENCOUNTER — Encounter (HOSPITAL_COMMUNITY): Payer: Self-pay

## 2013-10-07 ENCOUNTER — Telehealth: Payer: Self-pay | Admitting: *Deleted

## 2013-10-07 ENCOUNTER — Ambulatory Visit (HOSPITAL_COMMUNITY)
Admission: RE | Admit: 2013-10-07 | Discharge: 2013-10-07 | Disposition: A | Discharge status: Home / Self Care | Payer: Medicare Other | Source: Ambulatory Visit | Attending: Cardiology | Admitting: Cardiology

## 2013-10-07 VITALS — BP 120/61 | HR 90 | Resp 18 | Wt 93.2 lb

## 2013-10-07 DIAGNOSIS — E785 Hyperlipidemia, unspecified: Secondary | ICD-10-CM | POA: Diagnosis not present

## 2013-10-07 DIAGNOSIS — I2789 Other specified pulmonary heart diseases: Secondary | ICD-10-CM | POA: Diagnosis not present

## 2013-10-07 DIAGNOSIS — E039 Hypothyroidism, unspecified: Secondary | ICD-10-CM | POA: Insufficient documentation

## 2013-10-07 DIAGNOSIS — I4891 Unspecified atrial fibrillation: Secondary | ICD-10-CM

## 2013-10-07 DIAGNOSIS — N189 Chronic kidney disease, unspecified: Secondary | ICD-10-CM | POA: Insufficient documentation

## 2013-10-07 DIAGNOSIS — I4819 Other persistent atrial fibrillation: Secondary | ICD-10-CM

## 2013-10-07 DIAGNOSIS — I509 Heart failure, unspecified: Secondary | ICD-10-CM

## 2013-10-07 DIAGNOSIS — I701 Atherosclerosis of renal artery: Secondary | ICD-10-CM | POA: Insufficient documentation

## 2013-10-07 DIAGNOSIS — I2510 Atherosclerotic heart disease of native coronary artery without angina pectoris: Secondary | ICD-10-CM | POA: Insufficient documentation

## 2013-10-07 DIAGNOSIS — I129 Hypertensive chronic kidney disease with stage 1 through stage 4 chronic kidney disease, or unspecified chronic kidney disease: Secondary | ICD-10-CM | POA: Insufficient documentation

## 2013-10-07 DIAGNOSIS — I272 Pulmonary hypertension, unspecified: Secondary | ICD-10-CM

## 2013-10-07 DIAGNOSIS — I38 Endocarditis, valve unspecified: Secondary | ICD-10-CM | POA: Insufficient documentation

## 2013-10-07 DIAGNOSIS — Z7901 Long term (current) use of anticoagulants: Secondary | ICD-10-CM | POA: Diagnosis not present

## 2013-10-07 DIAGNOSIS — I5032 Chronic diastolic (congestive) heart failure: Secondary | ICD-10-CM | POA: Diagnosis not present

## 2013-10-07 LAB — BASIC METABOLIC PANEL
Anion gap: 17 — ABNORMAL HIGH (ref 5–15)
BUN: 76 mg/dL — ABNORMAL HIGH (ref 6–23)
CHLORIDE: 89 meq/L — AB (ref 96–112)
CO2: 32 mEq/L (ref 19–32)
Calcium: 9.7 mg/dL (ref 8.4–10.5)
Creatinine, Ser: 1.47 mg/dL — ABNORMAL HIGH (ref 0.50–1.10)
GFR calc non Af Amer: 29 mL/min — ABNORMAL LOW (ref >90)
GFR, EST AFRICAN AMERICAN: 33 mL/min — AB (ref >90)
Glucose, Bld: 182 mg/dL — ABNORMAL HIGH (ref 70–99)
POTASSIUM: 2.9 meq/L — AB (ref 3.7–5.3)
SODIUM: 138 meq/L (ref 137–147)

## 2013-10-07 MED ORDER — METOLAZONE 2.5 MG PO TABS: 2.5000 mg | ORAL_TABLET | ORAL | Status: DC

## 2013-10-07 MED ORDER — POTASSIUM CHLORIDE CRYS ER 10 MEQ PO TBCR: 60.0000 meq | EXTENDED_RELEASE_TABLET | Freq: Two times a day (BID) | ORAL | Status: DC

## 2013-10-07 NOTE — Telephone Encounter (Signed)
Pt aware of lab results and voiced understanding New rx sent to pharmacy Pt will return to CHF clinic for labs  10/14/13

## 2013-10-07 NOTE — Telephone Encounter (Signed)
Received a call from lab regarding patient have a critical K+ of 2.9. Patient seen in CHF clinic today, spoke [facility] CMA and gave results.  Aundra Dubin and [employer] CMA at clinic

## 2013-10-07 NOTE — Patient Instructions (Signed)
Labs today and again in 2 weeks  INCREASE Metolazone to 2.5 mg twice a week (Monday and Thursday)  Your physician recommends that you schedule a follow-up appointment in: 6 weeks  Do the following things EVERYDAY: 1) Weigh yourself in the morning before breakfast. Write it down and keep it in a log. 2) Take your medicines as prescribed 3) Eat low salt foods-Limit salt (sodium) to 2000 mg per day.  4) Stay as active as you can everyday 5) Limit all fluids for the day to less than 2 liters 6)

## 2013-10-07 NOTE — Telephone Encounter (Signed)
Increase KCl to 60 mEq bid and repeat BMET 1 week.

## 2013-10-08 NOTE — Progress Notes (Signed)
Patient ID: Janice Petersen, female   DOB: March 25, 1915, 80 y.o.   MRN: [id number] PCP: Stephanie Acre  72 yo with history of diastolic CHF, valvular heart disease, CAD, and chronic atrial fibrillation presents for cardiology followup.  She used to live in [facility] and was followed by a cardiologist down there.  She has moved to [facility] to be nearer her family (currently living with one of her sons).  She had a stent placed in her RCA in 2004.  She has had a long history of valvular disease, last echo showed moderate to severe MR and moderate to severe TR with severe pulmonary hypertension.  She has a history of diastolic CHF.  She has been in atrial fibrillation since 2003 on warfarin.  She returns for follow up. At last visit, metolazone was increased to twice a week.  She decided to go back to once a week on her own.  Weight is up 1 lb.  No dyspnea at rest.  She is short of breath after walking 75-100 feet.  No lightheadedness or falls.  No chest pain.  No orthopnea/PND.   Labs (2/13): K 4.1, creatinine 1.1 => 1.4, proBNP 683, LDL 54, HDL 44 Labs (4/13): Creatinine 1.87, K 4.9, BNP 486 Labs (6/13): K 3.6, creatinine 1.4 Labs (2/14): K 3.8, creatinine 1.3, BNP 446 Labs (6/14): K 3.6, creatinine 1.4, BNP 733 Labs (8/14): K 3.8, creatinine 1.3, BNP 721, LDL 49, HDl 52 Labs (11/14): K 4.4, creatinine 1.7 => 2.4 => 1.5, BNP 549 Labs (12/14): K 4.3, creatinine 1.5 Labs (4/15): K 3.8, BUN 104, creatinine 2.3 Labs (4/15): K 4.2, BUN 76, creatinine 1.57, pro-BNP 5119 Labs (07/30/13) K 3.3 Creatinine 1.5  Labs (6/15): K 3.1, creatinine 4.13  PMH: 1. Diastolic CHF: Echo (24/40) with EF 65%, mild AI, moderate to severe MR, moderate to severe TR, PA systolic pressure 84 mmHg.  She has been on home oxygen since 1/13.  2. Chronic atrial fibrillation since 2003. 3. PMR 4. H/o appendectomy 5. TAH 6. Renal artery stenosis: 50% left renal artery stenosis found on angiogram in 8/02.  7. Hypothyroidism 8. MRSA  sepsis in 2006 9. Hyperlipidemia 10. H/o PE 11. CAD: Cypher DES to proximal RCA in 2/04.  Last Lexiscan myoview in 2011 showed EF 67%, normal.  12. Valvular heart disease: Last echo in 10/12 showed moderate to severe MR, moderate to severe TR.  13. Pulmonary hypertension: Suspect secondary to elevated left atrial pressure as well as probable reactive pulmonary vascular changes.  14. CKD 15. Squamous cell CA right leg  SH: Widow. Moved from [place] to [place] and living with her son.  Has 3 sons.  Former smoker (quit years ago).  Rare ETOH.   FH: Mother with CHF at 43, father with multiple myeloma.   ROS: All systems reviewed and negative except as per HPI.   Current Outpatient Prescriptions  Medication Sig Dispense Refill  . bisoprolol (ZEBETA) 10 MG tablet TAKE 1 TABLET BY MOUTH EVERY DAY  90 tablet  0  . Calcium Carbonate-Vitamin D (CALCIUM 500 + D PO) Take by mouth daily.      . Coenzyme Q10 (COQ10 PO) Take 400 mg by mouth.      . furosemide (LASIX) 40 MG tablet Take 2 tablets (80 mg total) by mouth 2 (two) times daily.  120 tablet  3  . isosorbide mononitrate (IMDUR) 30 MG 24 hr tablet TAKE ONE TABLET DAILY      . levothyroxine (SYNTHROID, LEVOTHROID) 50 MCG  tablet Take 50 mcg by mouth daily before breakfast.      . lovastatin (MEVACOR) 20 MG tablet TAKE 1 TABLET BY MOUTH EVERY DAY  90 tablet  0  . metolazone (ZAROXOLYN) 2.5 MG tablet Take 1 tablet (2.5 mg total) by mouth once a week. Take 1 tablet  Twice a week Monday and Thursday. Hold if your weight is less than 88 lbs.  10 tablet  6  . NON FORMULARY 3 L daily. 3 Liters of oxygen      . Probiotic Product (ALIGN PO) Take by mouth daily.      Marland Kitchen warfarin (COUMADIN) 1 MG tablet TAKE AS DIRECTED BY ANTICOAGULATION CLINIC  60 tablet  3  . potassium chloride SA (K-DUR,KLOR-CON) 10 MEQ tablet Take 6 tablets (60 mEq total) by mouth 2 (two) times daily.  360 tablet  3   No current facility-administered medications for this  encounter.    BP 120/61  Pulse 90  Resp 18  Wt 93 lb 4 oz (42.298 kg)  SpO2 92% General: frail, NAD arrived in a wheelchair.  Neck: JVP 12 cm, no thyromegaly or thyroid nodule.  Lungs: Slight bilateral basilar crackles CV: Nondisplaced PMI.  Heart irregular S1/S2, no S3/S4, 3/6 HSM LLSB/apex.  No edema.  No carotid bruit.   Abdomen: Soft, nontender, no hepatosplenomegaly, no distention.     Neurologic: Alert and oriented x 3.  Psych: Normal affect. Extremities: No clubbing or cyanosis. R and LLE 1+ edema  Assessment/Plan:  Atrial fibrillation  Chronic atrial fibrillation.  Continue warfarin. No recent falls.  She is tolerating bisoprolol with good rate control.   CAD  Stable with no chest pain. Continue warfarin (no indication to add ASA), statin, beta blocker. Diastolic CHF, chronic  She remains mildly volume overloaded with basically stable weight.  - Continue lasix 80 mg bid.  - Increase Metolazone again to 2.5 mg twice a week and hold if weight is < 87 pounds.  - BMET/BNP today and 2 wks.   Pulmonary hypertension  Probably secondary to diastolic LV failure with elevated LA pressure and reactive pulmonary artery changes.  Valvular heart disease  Moderate to severe MR and TR, likely significant contributors to diastolic CHF and pulmonary HTN. Not an operative candidate.   Janice Petersen 10/08/2013

## 2013-10-14 ENCOUNTER — Ambulatory Visit (HOSPITAL_COMMUNITY)
Admission: RE | Admit: 2013-10-14 | Discharge: 2013-10-14 | Disposition: A | Discharge status: Home / Self Care | Payer: Medicare Other | Source: Ambulatory Visit | Attending: Internal Medicine | Admitting: Internal Medicine

## 2013-10-14 DIAGNOSIS — I509 Heart failure, unspecified: Secondary | ICD-10-CM | POA: Diagnosis not present

## 2013-10-14 DIAGNOSIS — I5032 Chronic diastolic (congestive) heart failure: Secondary | ICD-10-CM

## 2013-10-14 DIAGNOSIS — I5022 Chronic systolic (congestive) heart failure: Secondary | ICD-10-CM

## 2013-10-14 LAB — BASIC METABOLIC PANEL
ANION GAP: 13 (ref 5–15)
BUN: 79 mg/dL — AB (ref 6–23)
CALCIUM: 9.6 mg/dL (ref 8.4–10.5)
CHLORIDE: 95 meq/L — AB (ref 96–112)
CO2: 31 mEq/L (ref 19–32)
CREATININE: 1.55 mg/dL — AB (ref 0.50–1.10)
GFR calc non Af Amer: 27 mL/min — ABNORMAL LOW (ref >90)
GFR, EST AFRICAN AMERICAN: 31 mL/min — AB (ref >90)
Glucose, Bld: 135 mg/dL — ABNORMAL HIGH (ref 70–99)
Potassium: 4.1 mEq/L (ref 3.7–5.3)
Sodium: 139 mEq/L (ref 137–147)

## 2013-10-20 ENCOUNTER — Telehealth (HOSPITAL_COMMUNITY): Payer: Self-pay | Admitting: Vascular Surgery

## 2013-10-20 NOTE — Telephone Encounter (Signed)
Pt daughter called pt mouth is so dry due to increase of medication... And she wants the results of the blood test... Please advise

## 2013-10-20 NOTE — Telephone Encounter (Signed)
Spoke w/[name] gave lab results, she states pt has been experiencing more difficult with dry mouth, pt's wt stable at 91 lb today, she has been taking metolazone 2 times a week (every Mon and Yeoman).  Pt is having trouble swallowing b/c her mouth is so dry and she states this has occurred in the past when she is dehydrated, she states pt is only drinking about 1/2 -1 L at the most in a day, cr was slightly elevated, advised to have pt increase fluid slightly to at least 1 L daily, if not improving she will call back later this week

## 2013-10-28 ENCOUNTER — Ambulatory Visit (HOSPITAL_COMMUNITY)
Admission: RE | Admit: 2013-10-28 | Discharge: 2013-10-28 | Disposition: A | Discharge status: Home / Self Care | Payer: Medicare Other | Source: Ambulatory Visit | Attending: Internal Medicine | Admitting: Internal Medicine

## 2013-10-28 ENCOUNTER — Other Ambulatory Visit: Payer: Self-pay | Admitting: Cardiology

## 2013-10-28 DIAGNOSIS — I5022 Chronic systolic (congestive) heart failure: Secondary | ICD-10-CM

## 2013-10-28 DIAGNOSIS — I5032 Chronic diastolic (congestive) heart failure: Secondary | ICD-10-CM | POA: Insufficient documentation

## 2013-10-28 DIAGNOSIS — I509 Heart failure, unspecified: Secondary | ICD-10-CM | POA: Insufficient documentation

## 2013-10-28 LAB — BASIC METABOLIC PANEL
Anion gap: 17 — ABNORMAL HIGH (ref 5–15)
BUN: 65 mg/dL — AB (ref 6–23)
CALCIUM: 9.5 mg/dL (ref 8.4–10.5)
CHLORIDE: 94 meq/L — AB (ref 96–112)
CO2: 26 meq/L (ref 19–32)
Creatinine, Ser: 1.36 mg/dL — ABNORMAL HIGH (ref 0.50–1.10)
GFR calc Af Amer: 37 mL/min — ABNORMAL LOW (ref >90)
GFR calc non Af Amer: 32 mL/min — ABNORMAL LOW (ref >90)
Glucose, Bld: 164 mg/dL — ABNORMAL HIGH (ref 70–99)
Potassium: 4 mEq/L (ref 3.7–5.3)
Sodium: 137 mEq/L (ref 137–147)

## 2013-10-28 LAB — PRO B NATRIURETIC PEPTIDE: Pro B Natriuretic peptide (BNP): 2892 pg/mL — ABNORMAL HIGH (ref 0–450)

## 2013-10-30 ENCOUNTER — Telehealth (HOSPITAL_COMMUNITY): Payer: Self-pay | Admitting: Vascular Surgery

## 2013-10-30 DIAGNOSIS — I5022 Chronic systolic (congestive) heart failure: Secondary | ICD-10-CM

## 2013-10-30 MED ORDER — TORSEMIDE 20 MG PO TABS: 40.0000 mg | ORAL_TABLET | Freq: Two times a day (BID) | ORAL | Status: DC

## 2013-10-30 NOTE — Telephone Encounter (Signed)
Left message wanting lab results.. Please advise

## 2013-10-30 NOTE — Telephone Encounter (Signed)
Returned patient call concerning weight gain and abdominal distention/edema.  Per Junie Bame NP-C, instructed to take extra metolazone tablet today, discontinue furosemide, start torsemide 40mg  BID, and have BMET and PBNP checked at [address] office 1 week from today.  Aware and agreeable. Renee Pain

## 2013-11-06 ENCOUNTER — Ambulatory Visit (INDEPENDENT_AMBULATORY_CARE_PROVIDER_SITE_OTHER): Payer: Medicare Other | Admitting: *Deleted

## 2013-11-06 DIAGNOSIS — I509 Heart failure, unspecified: Secondary | ICD-10-CM

## 2013-11-06 DIAGNOSIS — I5022 Chronic systolic (congestive) heart failure: Secondary | ICD-10-CM

## 2013-11-06 DIAGNOSIS — R0602 Shortness of breath: Secondary | ICD-10-CM

## 2013-11-06 LAB — BASIC METABOLIC PANEL
BUN: 83 mg/dL — AB (ref 6–23)
CO2: 35 mEq/L — ABNORMAL HIGH (ref 19–32)
Calcium: 9.5 mg/dL (ref 8.4–10.5)
Chloride: 85 mEq/L — ABNORMAL LOW (ref 96–112)
Creatinine, Ser: 1.6 mg/dL — ABNORMAL HIGH (ref 0.4–1.2)
GFR: 31.96 mL/min — ABNORMAL LOW (ref >60.00)
GLUCOSE: 139 mg/dL — AB (ref 70–99)
POTASSIUM: 3 meq/L — AB (ref 3.5–5.1)
Sodium: 133 mEq/L — ABNORMAL LOW (ref 135–145)

## 2013-11-06 LAB — BRAIN NATRIURETIC PEPTIDE: Pro B Natriuretic peptide (BNP): 203 pg/mL — ABNORMAL HIGH (ref 0.0–100.0)

## 2013-11-09 ENCOUNTER — Telehealth (HOSPITAL_COMMUNITY): Payer: Self-pay | Admitting: Cardiology

## 2013-11-09 NOTE — Telephone Encounter (Signed)
Message copied by JEFFRIES, Sharlot Gowda on Mon Nov 09, 2013  3:07 PM ------      Message from: Larey Dresser      Created: Fri Nov 06, 2013  1:35 PM       Creatinine a little higher but think I would hold off on changes for now.  How much K is she taking? If 60 bid, would increase to 80 bid.  Will need BMET in 1 week. ------

## 2013-11-12 ENCOUNTER — Ambulatory Visit (INDEPENDENT_AMBULATORY_CARE_PROVIDER_SITE_OTHER): Payer: Medicare Other | Admitting: *Deleted

## 2013-11-12 DIAGNOSIS — Z7901 Long term (current) use of anticoagulants: Secondary | ICD-10-CM

## 2013-11-12 DIAGNOSIS — I4891 Unspecified atrial fibrillation: Secondary | ICD-10-CM

## 2013-11-12 DIAGNOSIS — Z5181 Encounter for therapeutic drug level monitoring: Secondary | ICD-10-CM

## 2013-11-12 LAB — POCT INR: INR: 2.1

## 2013-11-18 ENCOUNTER — Ambulatory Visit (HOSPITAL_COMMUNITY)
Admission: RE | Admit: 2013-11-18 | Discharge: 2013-11-18 | Disposition: A | Discharge status: Home / Self Care | Payer: Medicare Other | Source: Ambulatory Visit | Attending: Cardiology | Admitting: Cardiology

## 2013-11-18 ENCOUNTER — Other Ambulatory Visit (INDEPENDENT_AMBULATORY_CARE_PROVIDER_SITE_OTHER): Payer: Medicare Other

## 2013-11-18 VITALS — BP 116/62 | HR 100 | Wt 95.0 lb

## 2013-11-18 DIAGNOSIS — I079 Rheumatic tricuspid valve disease, unspecified: Secondary | ICD-10-CM | POA: Insufficient documentation

## 2013-11-18 DIAGNOSIS — Z7901 Long term (current) use of anticoagulants: Secondary | ICD-10-CM | POA: Insufficient documentation

## 2013-11-18 DIAGNOSIS — I4891 Unspecified atrial fibrillation: Secondary | ICD-10-CM | POA: Diagnosis not present

## 2013-11-18 DIAGNOSIS — Z9181 History of falling: Secondary | ICD-10-CM | POA: Diagnosis not present

## 2013-11-18 DIAGNOSIS — I059 Rheumatic mitral valve disease, unspecified: Secondary | ICD-10-CM | POA: Diagnosis not present

## 2013-11-18 DIAGNOSIS — R300 Dysuria: Secondary | ICD-10-CM | POA: Diagnosis not present

## 2013-11-18 DIAGNOSIS — I2789 Other specified pulmonary heart diseases: Secondary | ICD-10-CM | POA: Diagnosis not present

## 2013-11-18 DIAGNOSIS — I38 Endocarditis, valve unspecified: Secondary | ICD-10-CM

## 2013-11-18 DIAGNOSIS — I2510 Atherosclerotic heart disease of native coronary artery without angina pectoris: Secondary | ICD-10-CM | POA: Insufficient documentation

## 2013-11-18 DIAGNOSIS — I5032 Chronic diastolic (congestive) heart failure: Secondary | ICD-10-CM

## 2013-11-18 DIAGNOSIS — Z954 Presence of other heart-valve replacement: Secondary | ICD-10-CM | POA: Insufficient documentation

## 2013-11-18 DIAGNOSIS — N289 Disorder of kidney and ureter, unspecified: Secondary | ICD-10-CM

## 2013-11-18 DIAGNOSIS — I509 Heart failure, unspecified: Secondary | ICD-10-CM | POA: Insufficient documentation

## 2013-11-18 LAB — BASIC METABOLIC PANEL
BUN: 74 mg/dL — AB (ref 6–23)
CO2: 32 mEq/L (ref 19–32)
Calcium: 9.3 mg/dL (ref 8.4–10.5)
Chloride: 87 mEq/L — ABNORMAL LOW (ref 96–112)
Creatinine, Ser: 1.7 mg/dL — ABNORMAL HIGH (ref 0.4–1.2)
GFR: 30.41 mL/min — AB (ref >60.00)
GLUCOSE: 144 mg/dL — AB (ref 70–99)
POTASSIUM: 4.1 meq/L (ref 3.5–5.1)
Sodium: 131 mEq/L — ABNORMAL LOW (ref 135–145)

## 2013-11-18 MED ORDER — METOLAZONE 2.5 MG PO TABS: 2.5000 mg | ORAL_TABLET | ORAL | Status: DC

## 2013-11-18 MED ORDER — FUROSEMIDE 80 MG PO TABS: 80.0000 mg | ORAL_TABLET | Freq: Two times a day (BID) | ORAL | Status: DC

## 2013-11-18 NOTE — Patient Instructions (Signed)
Follow up 1 month  Stop torsemide   Take metolazone 2.5 mg Monday-Wednesday-Friday   Take lasix 80 mg twice a day  Do the following things EVERYDAY: 1) Weigh yourself in the morning before breakfast. Write it down and keep it in a log. 2) Take your medicines as prescribed 3) Eat low salt foods-Limit salt (sodium) to 2000 mg per day.  4) Stay as active as you can everyday 5) Limit all fluids for the day to less than 2 liters

## 2013-11-18 NOTE — Progress Notes (Signed)
Patient ID: Janice Petersen, female   DOB: 10-28-1915, 80 y.o.   MRN: [id number] PCP: Stephanie Acre  44 yo with history of diastolic CHF, valvular heart disease, CAD, and chronic atrial fibrillation presents for cardiology followup.  She used to live in [facility] and was followed by a cardiologist down there.  She has moved to [facility] to be nearer her family (currently living with one of her sons).  She had a stent placed in her RCA in 2004.  She has had a long history of valvular disease, last echo showed moderate to severe MR and moderate to severe TR with severe pulmonary hypertension.  She has a history of diastolic CHF.  She has been in atrial fibrillation since 2003 on warfarin.  She returns for follow up. At last visit, metolazone was again increased to twice a week with instructions to hold for weight <87 pounds. Lasix was also switched to torsemide 40 mg twice a day. Weight at home 89-92 pounds. Taking 3 ensure per day.  On 3 liters [place] oxygen. Having painful urination.   Labs (2/13): K 4.1, creatinine 1.1 => 1.4, proBNP 683, LDL 54, HDL 44 Labs (4/13): Creatinine 1.87, K 4.9, BNP 486 Labs (6/13): K 3.6, creatinine 1.4 Labs (2/14): K 3.8, creatinine 1.3, BNP 446 Labs (6/14): K 3.6, creatinine 1.4, BNP 733 Labs (8/14): K 3.8, creatinine 1.3, BNP 721, LDL 49, HDl 52 Labs (11/14): K 4.4, creatinine 1.7 => 2.4 => 1.5, BNP 549 Labs (12/14): K 4.3, creatinine 1.5 Labs (4/15): K 3.8, BUN 104, creatinine 2.3 Labs (4/15): K 4.2, BUN 76, creatinine 1.57, pro-BNP 5119 Labs (07/30/13) K 3.3 Creatinine 1.5  Labs (6/15): K 3.1, creatinine 1.41 Labs (11/06/13): K 3.0 Creatinine  1.6 Pro BNP 203   PMH: 1. Diastolic CHF: Echo (61/95) with EF 65%, mild AI, moderate to severe MR, moderate to severe TR, PA systolic pressure 84 mmHg.  She has been on home oxygen since 1/13.  2. Chronic atrial fibrillation since 2003. 3. PMR 4. H/o appendectomy 5. TAH 6. Renal artery stenosis: 50% left renal artery stenosis  found on angiogram in 8/02.  7. Hypothyroidism 8. MRSA sepsis in 2006 9. Hyperlipidemia 10. H/o PE 11. CAD: Cypher DES to proximal RCA in 2/04.  Last Lexiscan myoview in 2011 showed EF 67%, normal.  12. Valvular heart disease: Last echo in 10/12 showed moderate to severe MR, moderate to severe TR.  13. Pulmonary hypertension: Suspect secondary to elevated left atrial pressure as well as probable reactive pulmonary vascular changes.  14. CKD 15. Squamous cell CA right leg  SH: Widow. Moved from [place] to [place] and living with her son.  Has 3 sons.  Former smoker (quit years ago).  Rare ETOH.   FH: Mother with CHF at 30, father with multiple myeloma.   ROS: All systems reviewed and negative except as per HPI.   Current Outpatient Prescriptions  Medication Sig Dispense Refill  . bisoprolol (ZEBETA) 10 MG tablet TAKE 1 TABLET BY MOUTH EVERY DAY  90 tablet  0  . Calcium Carbonate-Vitamin D (CALCIUM 500 + D PO) Take by mouth daily.      . Coenzyme Q10 (COQ10 PO) Take 400 mg by mouth.      . isosorbide mononitrate (IMDUR) 30 MG 24 hr tablet TAKE ONE TABLET DAILY      . levothyroxine (SYNTHROID, LEVOTHROID) 50 MCG tablet Take 50 mcg by mouth daily before breakfast.      . lovastatin (MEVACOR) 20 MG tablet  TAKE 1 TABLET BY MOUTH EVERY DAY  90 tablet  0  . metolazone (ZAROXOLYN) 2.5 MG tablet Take 1 tablet (2.5 mg total) by mouth once a week. Take 1 tablet  Twice a week Monday and Thursday. Hold if your weight is less than 88 lbs.  10 tablet  6  . NON FORMULARY 3 L daily. 3 Liters of oxygen      . potassium chloride (K-DUR,KLOR-CON) 10 MEQ tablet Take 10 mEq by mouth 2 (two) times daily. Pt take 8 tablets BID      . Probiotic Product (ALIGN PO) Take by mouth daily.      Marland Kitchen torsemide (DEMADEX) 20 MG tablet Take 2 tablets (40 mg total) by mouth 2 (two) times daily.  180 tablet  3  . warfarin (COUMADIN) 1 MG tablet TAKE AS DIRECTED BY ANTICOAGULATION CLINIC  60 tablet  3   No current  facility-administered medications for this encounter.    BP 116/62  Pulse 100  Wt 95 lb (43.092 kg)  SpO2 95% General: frail, NAD arrived in a wheelchair.  Neck: JVP 7-8 cm, no thyromegaly or thyroid nodule.  Lungs: Crackles in RLL LLL CV: Nondisplaced PMI.  Heart irregular S1/S2, no S3/S4, 3/6 HSM LLSB/apex.  No edema.  No carotid bruit.   Abdomen: Soft, nontender, no hepatosplenomegaly, ++ distention.     Neurologic: Alert and oriented x 3.  Psych: Normal affect. Extremities: No clubbing or cyanosis. R and LLE trace edema  Assessment/Plan:  Atrial fibrillation  Chronic atrial fibrillation.  Continue warfarin. No recent falls.   CAD  Stable with no chest pain. Continue warfarin (no indication to add ASA), statin, beta blocker. Diastolic CHF, chronic  She remains mildly volume overloaded with basically stable weight.  - Strange sensation with torsemide. Will switch back to lasix 80 mg twice a day.   - Increase  metolazone 2.5 mg three times a week. Hold metolazone for weight < 88 pounds.  - BMET/BNP today   Pulmonary hypertension  Probably secondary to diastolic LV failure with elevated LA pressure and reactive pulmonary artery changes.  Valvular heart disease  Moderate to severe MR and TR, likely significant contributors to diastolic CHF and pulmonary HTN. Not an operative candidate.  Dysuria- Asked her to follow up with PCP.   Follow up in 1 month with Dr Aundra Dubin.   CLEGG,[name] 11/18/2013  Patient seen and examined with Darrick Grinder, NP. We discussed all aspects of the encounter. I agree with the assessment and plan as stated above.   She continues to be very tenuous with difficult to manage volume status. Weight is up again despite recent switch to demadex and metolazone 2x/week. She feels that demadex is not working any better than lasix and it also makes her feel funny. Will switch back to lasix 80 bid and increase metolazone to M/W/F. Watch renal function closely. May be  getting near point where EOL discussions are in order.  Daniel Bensimhon,MD 2:23 PM

## 2013-12-07 ENCOUNTER — Telehealth (HOSPITAL_COMMUNITY): Payer: Self-pay | Admitting: *Deleted

## 2013-12-07 NOTE — Telephone Encounter (Signed)
Pt called in with some concerns, she states wt is up to 93 lbs which is the highest it has been, it usually runs around 90-91 lb, she states abd is slightly swollen and she occ has SOB, at last OV her Torsemide was switched to Lasix 80 mg Twice daily, as she was unable to tolerate Torsemide, and her Metolazone was increased to [place], Wed and Fri, and she did take it today.  She also reports she has had diarrhea for a couple of weeks every time she eats and she really has no appetite.  Will discuss w/Dr Aundra Dubin and call her back tomorrow she is agreeable

## 2013-12-07 NOTE — Telephone Encounter (Signed)
Would be reticent to increase her metolazone any further without evaluating her in office.  If weight is not down on Tuesday, would arrange office visit.

## 2013-12-08 ENCOUNTER — Telehealth (HOSPITAL_COMMUNITY): Payer: Self-pay | Admitting: Vascular Surgery

## 2013-12-08 NOTE — Telephone Encounter (Signed)
Pt weight is up to 93lbs the pt is usually 80 lbs or so ... She is taking medicine metolazone and lasix .Marland Kitchen Pt has diarrhea .Marland Kitchen Please advise

## 2013-12-08 NOTE — Telephone Encounter (Signed)
Spoke w/Janice Petersen this AM, she states pt wt is still 93 lb today and she is concerned about her appt sch with Dr Aundra Dubin for tomorrow at 35:15

## 2013-12-09 ENCOUNTER — Ambulatory Visit (HOSPITAL_COMMUNITY)
Admission: RE | Admit: 2013-12-09 | Discharge: 2013-12-09 | Disposition: A | Discharge status: Home / Self Care | Payer: Medicare Other | Source: Ambulatory Visit | Attending: Cardiology | Admitting: Cardiology

## 2013-12-09 ENCOUNTER — Encounter (HOSPITAL_COMMUNITY): Payer: Self-pay

## 2013-12-09 VITALS — BP 98/56 | HR 93 | Wt 95.8 lb

## 2013-12-09 DIAGNOSIS — I509 Heart failure, unspecified: Secondary | ICD-10-CM

## 2013-12-09 DIAGNOSIS — I2789 Other specified pulmonary heart diseases: Secondary | ICD-10-CM | POA: Diagnosis not present

## 2013-12-09 DIAGNOSIS — Z7901 Long term (current) use of anticoagulants: Secondary | ICD-10-CM | POA: Diagnosis not present

## 2013-12-09 DIAGNOSIS — I5032 Chronic diastolic (congestive) heart failure: Secondary | ICD-10-CM | POA: Diagnosis present

## 2013-12-09 DIAGNOSIS — I2510 Atherosclerotic heart disease of native coronary artery without angina pectoris: Secondary | ICD-10-CM | POA: Diagnosis not present

## 2013-12-09 DIAGNOSIS — I4891 Unspecified atrial fibrillation: Secondary | ICD-10-CM | POA: Insufficient documentation

## 2013-12-09 DIAGNOSIS — N183 Chronic kidney disease, stage 3 unspecified: Secondary | ICD-10-CM | POA: Diagnosis not present

## 2013-12-09 DIAGNOSIS — I38 Endocarditis, valve unspecified: Secondary | ICD-10-CM

## 2013-12-09 LAB — BASIC METABOLIC PANEL
Anion gap: 14 (ref 5–15)
BUN: 47 mg/dL — AB (ref 6–23)
CALCIUM: 9.7 mg/dL (ref 8.4–10.5)
CO2: 27 meq/L (ref 19–32)
CREATININE: 1.41 mg/dL — AB (ref 0.50–1.10)
Chloride: 96 mEq/L (ref 96–112)
GFR calc Af Amer: 35 mL/min — ABNORMAL LOW (ref >90)
GFR calc non Af Amer: 30 mL/min — ABNORMAL LOW (ref >90)
Glucose, Bld: 186 mg/dL — ABNORMAL HIGH (ref 70–99)
Potassium: 4.7 mEq/L (ref 3.7–5.3)
Sodium: 137 mEq/L (ref 137–147)

## 2013-12-09 LAB — PRO B NATRIURETIC PEPTIDE: PRO B NATRI PEPTIDE: 2598 pg/mL — AB (ref 0–450)

## 2013-12-09 MED ORDER — FUROSEMIDE 80 MG PO TABS: ORAL_TABLET | ORAL | Status: DC

## 2013-12-09 MED ORDER — FUROSEMIDE 10 MG/ML IJ SOLN
80.0000 mg | Freq: Once | INTRAMUSCULAR | Status: AC
  Administered 2013-12-09: 80 mg via INTRAVENOUS

## 2013-12-09 NOTE — Telephone Encounter (Signed)
Appointment with Dr. Aundra Dubin today in our office to evaluate.

## 2013-12-09 NOTE — Patient Instructions (Signed)
DO NOT TAKE ANY FUROSEMIDE (LASIX) TODAY  Starting 12/10/13 increase Furosemide to 120 mg (1 & 1/2 tabs) in AM and 80 mg (1 tab) in PM  Your physician recommends that you schedule a follow-up appointment in: 10 days with labs

## 2013-12-09 NOTE — Progress Notes (Signed)
Patient ID: Janice Petersen, female   DOB: 1915/06/28, 80 y.o.   MRN: [id number] PCP: Stephanie Acre  76 yo with history of diastolic CHF, valvular heart disease, CAD, and chronic atrial fibrillation presents for cardiology followup.  She used to live in [facility] and was followed by a cardiologist down there.  She has moved to [facility] to be nearer her family (currently living with one of her sons).  She had a stent placed in her RCA in 2004.  She has had a long history of valvular disease, last echo showed moderate to severe MR and moderate to severe TR with severe pulmonary hypertension.  She has a history of diastolic CHF.  She has been in atrial fibrillation since 2003 on warfarin.  Follow up for Heart Failure: Last visit changed back to lasix 80 mg BID with metolazone 2.5 mg on M/W/F. Has been calling with increase weight gain to 93 lbs (baseline 90-91 lbs). Reports not much of an appetite and having diarrhea for about 2-3 weeks. Reports more SOB and abdominal distention.  She is still able to walk to her mailbox without much problem. Denies CP, orthopnea or LE edema. Wears 3L [place] oxygen. Drinking at least a liter a day but less than 2L a day. Weight at home 93 lbs.   Labs (2/13): K 4.1, creatinine 1.1 => 1.4, proBNP 683, LDL 54, HDL 44 Labs (4/13): Creatinine 1.87, K 4.9, BNP 486 Labs (6/13): K 3.6, creatinine 1.4 Labs (2/14): K 3.8, creatinine 1.3, BNP 446 Labs (6/14): K 3.6, creatinine 1.4, BNP 733 Labs (8/14): K 3.8, creatinine 1.3, BNP 721, LDL 49, HDl 52 Labs (11/14): K 4.4, creatinine 1.7 => 2.4 => 1.5, BNP 549 Labs (12/14): K 4.3, creatinine 1.5 Labs (4/15): K 3.8, BUN 104, creatinine 2.3 Labs (4/15): K 4.2, BUN 76, creatinine 1.57, pro-BNP 5119 Labs (07/30/13) K 3.3 Creatinine 1.5  Labs (6/15): K 3.1, creatinine 1.41 Labs (11/06/13): K 3.0 Creatinine  1.6 Pro BNP 203  Labs (9/15): K 4.1, creatinine 1.7, BUN 74  PMH: 1. Diastolic CHF: Echo (40/34) with EF 65%, mild AI, moderate to severe MR,  moderate to severe TR, PA systolic pressure 84 mmHg.  She has been on home oxygen since 1/13.  2. Chronic atrial fibrillation since 2003. 3. PMR 4. H/o appendectomy 5. TAH 6. Renal artery stenosis: 50% left renal artery stenosis found on angiogram in 8/02.  7. Hypothyroidism 8. MRSA sepsis in 2006 9. Hyperlipidemia 10. H/o PE 11. CAD: Cypher DES to proximal RCA in 2/04.  Last Lexiscan myoview in 2011 showed EF 67%, normal.  12. Valvular heart disease: Last echo in 10/12 showed moderate to severe MR, moderate to severe TR.  13. Pulmonary hypertension: Suspect secondary to elevated left atrial pressure as well as probable reactive pulmonary vascular changes.  14. CKD 15. Squamous cell CA right leg  SH: Widow. Moved from [place] to [place] and living with her son.  Has 3 sons.  Former smoker (quit years ago).  Rare ETOH.   FH: Mother with CHF at 17, father with multiple myeloma.   ROS: All systems reviewed and negative except as per HPI.   Current Outpatient Prescriptions  Medication Sig Dispense Refill  . bisoprolol (ZEBETA) 10 MG tablet TAKE 1 TABLET BY MOUTH EVERY DAY  90 tablet  0  . Calcium Carbonate-Vitamin D (CALCIUM 500 + D PO) Take by mouth daily.      . Coenzyme Q10 (COQ10 PO) Take 400 mg by mouth.      Marland Kitchen  furosemide (LASIX) 80 MG tablet Take 1 tablet (80 mg total) by mouth 2 (two) times daily.  60 tablet  6  . isosorbide mononitrate (IMDUR) 30 MG 24 hr tablet TAKE ONE TABLET DAILY      . levothyroxine (SYNTHROID, LEVOTHROID) 50 MCG tablet Take 50 mcg by mouth daily before breakfast.      . lovastatin (MEVACOR) 20 MG tablet TAKE 1 TABLET BY MOUTH EVERY DAY  90 tablet  0  . metolazone (ZAROXOLYN) 2.5 MG tablet Take 1 tablet (2.5 mg total) by mouth 3 (three) times a week. Monday -Wednesday and Friday . Hold if your weight is less than 88 lbs.  12 tablet  6  . NON FORMULARY 3 L daily. 3 Liters of oxygen      . potassium chloride (K-DUR,KLOR-CON) 10 MEQ tablet Take 10 mEq  by mouth 2 (two) times daily. Pt take 8 tablets BID      . Probiotic Product (ALIGN PO) Take by mouth daily.      Marland Kitchen warfarin (COUMADIN) 1 MG tablet TAKE AS DIRECTED BY ANTICOAGULATION CLINIC  60 tablet  3   No current facility-administered medications for this encounter.    BP 98/56  Pulse 93  Wt 95 lb 12.8 oz (43.455 kg)  SpO2 93% General: frail, NAD arrived in a wheelchair.  Neck: JVP 14-16 cm with prominent CV waves, no thyromegaly or thyroid nodule.  Lungs: Crackles in RLL LLL CV: Nondisplaced PMI.  Heart irregular S1/S2, no S3/S4, 3/6 HSM LLSB/apex.  1+ ankle edema.  No carotid bruit.   Abdomen: Soft, nontender, no hepatosplenomegaly, mild distention.     Neurologic: Alert and oriented x 3.  Psych: Normal affect. Extremities: No clubbing or cyanosis.   Assessment/Plan:  Atrial fibrillation  Chronic atrial fibrillation.  Continue warfarin. No recent falls.   CAD  Stable with no chest pain. Continue warfarin (no indication to add ASA), statin, beta blocker. Diastolic CHF, chronic  She remains volume overloaded with NYHA class III symptoms.  I suspect that the poor appetite is related to gut congestion.  - Increase Lasix to 120 qam, 80 qpm.  Continue metolazone 3 times a week. - I will have her get a dose of Lasix 80 mg IV x 1 in the office today.  Pulmonary hypertension  Probably secondary to diastolic LV failure with elevated LA pressure and reactive pulmonary artery changes.  Valvular heart disease  Moderate to severe MR and TR, likely significant contributors to diastolic CHF and pulmonary HTN. Not an operative candidate.  CKD Creatinine gradually worsening over time.  Cardiorenal syndrome worsening.  Will get BMET in 10 days after diuretic change.   Rande Brunt, NP 9:37 AM  Patient seen with NP, agree with the above note.  Patient is volume overloaded but also with worsening cardiorenal syndrome.  I will have her increase Lasix to 120 qam, 80 qpm and will give her a  dose of IV Lasix in the office today.  Followup in 10 days with BMET.   [name] 12/09/2013

## 2013-12-09 NOTE — Progress Notes (Addendum)
22g PIV started in RAC x 1 attempt, 80mg  IV lasix pushed and flushed, saline locked, patient tolerated well.  Patient resting comfortably in Adv. CHF clinic in recliner with daughter and call bell at side.  Will continue to monitor closely.  Total Urinary Output: 625cc  PIV removed before patient discharged from appointment.  Janice Petersen

## 2013-12-16 ENCOUNTER — Encounter (HOSPITAL_COMMUNITY): Payer: Medicare Other

## 2013-12-18 ENCOUNTER — Ambulatory Visit (HOSPITAL_COMMUNITY)
Admission: RE | Admit: 2013-12-18 | Discharge: 2013-12-18 | Disposition: A | Discharge status: Home / Self Care | Payer: Medicare Other | Source: Ambulatory Visit | Attending: Internal Medicine | Admitting: Internal Medicine

## 2013-12-18 ENCOUNTER — Encounter (HOSPITAL_COMMUNITY): Payer: Self-pay

## 2013-12-18 VITALS — BP 104/60 | HR 97 | Wt 94.4 lb

## 2013-12-18 DIAGNOSIS — Z87891 Personal history of nicotine dependence: Secondary | ICD-10-CM | POA: Diagnosis not present

## 2013-12-18 DIAGNOSIS — N183 Chronic kidney disease, stage 3 (moderate): Secondary | ICD-10-CM

## 2013-12-18 DIAGNOSIS — I2510 Atherosclerotic heart disease of native coronary artery without angina pectoris: Secondary | ICD-10-CM | POA: Diagnosis not present

## 2013-12-18 DIAGNOSIS — Z7901 Long term (current) use of anticoagulants: Secondary | ICD-10-CM | POA: Diagnosis not present

## 2013-12-18 DIAGNOSIS — I482 Chronic atrial fibrillation: Secondary | ICD-10-CM | POA: Insufficient documentation

## 2013-12-18 DIAGNOSIS — I38 Endocarditis, valve unspecified: Secondary | ICD-10-CM | POA: Diagnosis not present

## 2013-12-18 DIAGNOSIS — I701 Atherosclerosis of renal artery: Secondary | ICD-10-CM | POA: Diagnosis not present

## 2013-12-18 DIAGNOSIS — I5030 Unspecified diastolic (congestive) heart failure: Secondary | ICD-10-CM | POA: Insufficient documentation

## 2013-12-18 DIAGNOSIS — I272 Other secondary pulmonary hypertension: Secondary | ICD-10-CM | POA: Insufficient documentation

## 2013-12-18 DIAGNOSIS — Z79899 Other long term (current) drug therapy: Secondary | ICD-10-CM | POA: Insufficient documentation

## 2013-12-18 DIAGNOSIS — N189 Chronic kidney disease, unspecified: Secondary | ICD-10-CM | POA: Insufficient documentation

## 2013-12-18 DIAGNOSIS — I5032 Chronic diastolic (congestive) heart failure: Secondary | ICD-10-CM

## 2013-12-18 DIAGNOSIS — E039 Hypothyroidism, unspecified: Secondary | ICD-10-CM | POA: Insufficient documentation

## 2013-12-18 DIAGNOSIS — E785 Hyperlipidemia, unspecified: Secondary | ICD-10-CM | POA: Insufficient documentation

## 2013-12-18 LAB — BASIC METABOLIC PANEL
ANION GAP: 15 (ref 5–15)
BUN: 69 mg/dL — ABNORMAL HIGH (ref 6–23)
CALCIUM: 10 mg/dL (ref 8.4–10.5)
CO2: 28 meq/L (ref 19–32)
CREATININE: 1.48 mg/dL — AB (ref 0.50–1.10)
Chloride: 95 mEq/L — ABNORMAL LOW (ref 96–112)
GFR calc Af Amer: 33 mL/min — ABNORMAL LOW (ref >90)
GFR, EST NON AFRICAN AMERICAN: 29 mL/min — AB (ref >90)
Glucose, Bld: 130 mg/dL — ABNORMAL HIGH (ref 70–99)
Potassium: 5 mEq/L (ref 3.7–5.3)
SODIUM: 138 meq/L (ref 137–147)

## 2013-12-18 NOTE — Patient Instructions (Signed)
Labs today.  Your physician recommends that you schedule a follow-up appointment in: 1 month  Do the following things EVERYDAY: 1) Weigh yourself in the morning before breakfast. Write it down and keep it in a log. 2) Take your medicines as prescribed 3) Eat low salt foods-Limit salt (sodium) to 2000 mg per day.  4) Stay as active as you can everyday 5) Limit all fluids for the day to less than 2 liters 6)  

## 2013-12-20 NOTE — Progress Notes (Signed)
Patient ID: Janice Petersen, female   DOB: 28-Aug-1915, 80 y.o.   MRN: [id number] PCP: Stephanie Acre  80 yo with history of diastolic CHF, valvular heart disease, CAD, and chronic atrial fibrillation presents for cardiology followup.  She used to live in [facility] and was followed by a cardiologist down there.  She has moved to [facility] to be nearer her family (currently living with one of her sons).  She had a stent placed in her RCA in 2004.  She has had a long history of valvular disease, last echo showed moderate to severe MR and moderate to severe TR with severe pulmonary hypertension.  She has a history of diastolic CHF.  She has been in atrial fibrillation since 2003 on warfarin.  Follow up for Heart Failure: Lasix increased at last visit.  Weight is down 1 lb.  She continues to have a poor appetite but is drinking Ensure three times a day.  She is walking around her house and out in the yard without much dyspnea, this is better than at last appointment.     Labs (2/13): K 4.1, creatinine 1.1 => 1.4, proBNP 683, LDL 54, HDL 44 Labs (4/13): Creatinine 1.87, K 4.9, BNP 486 Labs (6/13): K 3.6, creatinine 1.4 Labs (2/14): K 3.8, creatinine 1.3, BNP 446 Labs (6/14): K 3.6, creatinine 1.4, BNP 733 Labs (8/14): K 3.8, creatinine 1.3, BNP 721, LDL 49, HDl 52 Labs (11/14): K 4.4, creatinine 1.7 => 2.4 => 1.5, BNP 549 Labs (12/14): K 4.3, creatinine 1.5 Labs (4/15): K 3.8, BUN 104, creatinine 2.3 Labs (4/15): K 4.2, BUN 76, creatinine 1.57, pro-BNP 5119 Labs (07/30/13) K 3.3 Creatinine 1.5  Labs (6/15): K 3.1, creatinine 1.41 Labs (11/06/13): K 3.0 Creatinine  1.6 Pro BNP 203  Labs (9/15): K 4.1, creatinine 1.7 => 1.4, BUN 74 => 47, BNP 2637  PMH: 1. Diastolic CHF: Echo (85/88) with EF 65%, mild AI, moderate to severe MR, moderate to severe TR, PA systolic pressure 84 mmHg.  She has been on home oxygen since 1/13.  2. Chronic atrial fibrillation since 2003. 3. PMR 4. H/o appendectomy 5. TAH 6. Renal  artery stenosis: 50% left renal artery stenosis found on angiogram in 8/02.  7. Hypothyroidism 8. MRSA sepsis in 2006 9. Hyperlipidemia 10. H/o PE 11. CAD: Cypher DES to proximal RCA in 2/04.  Last Lexiscan myoview in 2011 showed EF 67%, normal.  12. Valvular heart disease: Last echo in 10/12 showed moderate to severe MR, moderate to severe TR.  13. Pulmonary hypertension: Suspect secondary to elevated left atrial pressure as well as probable reactive pulmonary vascular changes.  14. CKD 15. Squamous cell CA right leg  SH: Widow. Moved from [place] to [place] and living with her son.  Has 3 sons.  Former smoker (quit years ago).  Rare ETOH.   FH: Mother with CHF at 49, father with multiple myeloma.   ROS: All systems reviewed and negative except as per HPI.   Current Outpatient Prescriptions  Medication Sig Dispense Refill  . bisoprolol (ZEBETA) 10 MG tablet TAKE 1 TABLET BY MOUTH EVERY DAY  90 tablet  0  . Calcium Carbonate-Vitamin D (CALCIUM 500 + D PO) Take by mouth daily.      . Coenzyme Q10 (COQ10 PO) Take 400 mg by mouth.      . furosemide (LASIX) 80 MG tablet Take 1 & 1/2 tabs in AM and 1 tab in PM  60 tablet  6  . isosorbide mononitrate (IMDUR)  30 MG 24 hr tablet TAKE ONE TABLET DAILY      . levothyroxine (SYNTHROID, LEVOTHROID) 50 MCG tablet Take 50 mcg by mouth daily before breakfast.      . lovastatin (MEVACOR) 20 MG tablet TAKE 1 TABLET BY MOUTH EVERY DAY  90 tablet  0  . metolazone (ZAROXOLYN) 2.5 MG tablet Take 1 tablet (2.5 mg total) by mouth 3 (three) times a week. Monday -Wednesday and Friday . Hold if your weight is less than 88 lbs.  12 tablet  6  . NON FORMULARY 3 L daily. 3 Liters of oxygen      . potassium chloride (K-DUR,KLOR-CON) 10 MEQ tablet Take 10 mEq by mouth 2 (two) times daily. Pt take 8 tablets BID      . Probiotic Product (ALIGN PO) Take by mouth daily.      Marland Kitchen warfarin (COUMADIN) 1 MG tablet TAKE AS DIRECTED BY ANTICOAGULATION CLINIC  60 tablet   3   No current facility-administered medications for this encounter.    BP 104/60  Pulse 97  Wt 94 lb 6.4 oz (42.82 kg)  SpO2 98% General: frail, NAD arrived in a wheelchair.  Neck: JVP 10-12 cm with prominent CV waves, no thyromegaly or thyroid nodule.  Lungs: Crackles in RLL LLL CV: Nondisplaced PMI.  Heart irregular S1/S2, no S3/S4, 3/6 HSM LLSB/apex.  No edema.  No carotid bruit.   Abdomen: Soft, nontender, no hepatosplenomegaly, mild distention.     Neurologic: Alert and oriented x 3.  Psych: Normal affect. Extremities: No clubbing or cyanosis.   Assessment/Plan:  Atrial fibrillation  Chronic atrial fibrillation.  Continue warfarin. No recent falls.   CAD  Stable with no chest pain. Continue warfarin (no indication to add ASA), statin, beta blocker. Diastolic CHF, chronic  Volume status improved, still NYHA class III but better.  I suspect that the poor appetite may be in part related to gut congestion.  - Continue Lasix 120 qam, 80 qpm.  Continue metolazone 3 times a week. - BMET today.   Pulmonary hypertension  Probably secondary to diastolic LV failure with elevated LA pressure and reactive pulmonary artery changes.  Valvular heart disease  Moderate to severe MR and TR, likely significant contributors to diastolic CHF and pulmonary HTN. Not an operative candidate.  CKD Creatinine stable at last check.  Will repeat today.   [name] 12/20/2013

## 2013-12-21 ENCOUNTER — Telehealth (HOSPITAL_COMMUNITY): Payer: Self-pay

## 2013-12-21 ENCOUNTER — Other Ambulatory Visit (HOSPITAL_COMMUNITY): Payer: Self-pay

## 2013-12-21 ENCOUNTER — Encounter (HOSPITAL_COMMUNITY): Payer: Medicare Other

## 2013-12-21 MED ORDER — POTASSIUM CHLORIDE CRYS ER 10 MEQ PO TBCR: 50.0000 meq | EXTENDED_RELEASE_TABLET | Freq: Two times a day (BID) | ORAL | Status: DC

## 2013-12-21 NOTE — Telephone Encounter (Signed)
Lab results reviewed with patient's daughter who is her primary care person at home.  Instructed to reduce potassium dosage to 50 meq (5 tablets ) twice daily.  Aware and agreeable.  Rx updated at preferred pharmacy electronically. Renee Pain

## 2013-12-23 ENCOUNTER — Ambulatory Visit (INDEPENDENT_AMBULATORY_CARE_PROVIDER_SITE_OTHER): Payer: Medicare Other | Admitting: Pharmacist

## 2013-12-23 DIAGNOSIS — Z23 Encounter for immunization: Secondary | ICD-10-CM

## 2013-12-23 DIAGNOSIS — Z7901 Long term (current) use of anticoagulants: Secondary | ICD-10-CM

## 2013-12-23 DIAGNOSIS — Z5181 Encounter for therapeutic drug level monitoring: Secondary | ICD-10-CM

## 2013-12-23 DIAGNOSIS — I4891 Unspecified atrial fibrillation: Secondary | ICD-10-CM

## 2013-12-23 LAB — POCT INR: INR: 2.5

## 2014-01-01 ENCOUNTER — Other Ambulatory Visit: Payer: Self-pay | Admitting: Cardiology

## 2014-01-05 ENCOUNTER — Other Ambulatory Visit (HOSPITAL_COMMUNITY): Payer: Self-pay | Admitting: *Deleted

## 2014-01-05 DIAGNOSIS — I5032 Chronic diastolic (congestive) heart failure: Secondary | ICD-10-CM

## 2014-01-05 MED ORDER — METOLAZONE 2.5 MG PO TABS: 2.5000 mg | ORAL_TABLET | ORAL | Status: DC

## 2014-01-19 ENCOUNTER — Ambulatory Visit (HOSPITAL_COMMUNITY)
Admission: RE | Admit: 2014-01-19 | Discharge: 2014-01-19 | Disposition: A | Discharge status: Home / Self Care | Payer: Medicare Other | Source: Ambulatory Visit | Attending: Cardiology | Admitting: Cardiology

## 2014-01-19 VITALS — BP 116/62 | HR 90 | Wt 94.5 lb

## 2014-01-19 DIAGNOSIS — I2510 Atherosclerotic heart disease of native coronary artery without angina pectoris: Secondary | ICD-10-CM | POA: Insufficient documentation

## 2014-01-19 DIAGNOSIS — N189 Chronic kidney disease, unspecified: Secondary | ICD-10-CM | POA: Diagnosis not present

## 2014-01-19 DIAGNOSIS — Z7901 Long term (current) use of anticoagulants: Secondary | ICD-10-CM | POA: Insufficient documentation

## 2014-01-19 DIAGNOSIS — I5032 Chronic diastolic (congestive) heart failure: Secondary | ICD-10-CM

## 2014-01-19 DIAGNOSIS — I38 Endocarditis, valve unspecified: Secondary | ICD-10-CM | POA: Insufficient documentation

## 2014-01-19 DIAGNOSIS — I071 Rheumatic tricuspid insufficiency: Secondary | ICD-10-CM | POA: Diagnosis not present

## 2014-01-19 DIAGNOSIS — I482 Chronic atrial fibrillation: Secondary | ICD-10-CM | POA: Insufficient documentation

## 2014-01-19 DIAGNOSIS — I4891 Unspecified atrial fibrillation: Secondary | ICD-10-CM

## 2014-01-19 DIAGNOSIS — E785 Hyperlipidemia, unspecified: Secondary | ICD-10-CM | POA: Diagnosis not present

## 2014-01-19 DIAGNOSIS — I340 Nonrheumatic mitral (valve) insufficiency: Secondary | ICD-10-CM | POA: Diagnosis not present

## 2014-01-19 DIAGNOSIS — Z79899 Other long term (current) drug therapy: Secondary | ICD-10-CM | POA: Diagnosis not present

## 2014-01-19 DIAGNOSIS — Z87891 Personal history of nicotine dependence: Secondary | ICD-10-CM | POA: Insufficient documentation

## 2014-01-19 DIAGNOSIS — I129 Hypertensive chronic kidney disease with stage 1 through stage 4 chronic kidney disease, or unspecified chronic kidney disease: Secondary | ICD-10-CM | POA: Diagnosis not present

## 2014-01-19 DIAGNOSIS — N183 Chronic kidney disease, stage 3 (moderate): Secondary | ICD-10-CM

## 2014-01-19 DIAGNOSIS — E039 Hypothyroidism, unspecified: Secondary | ICD-10-CM | POA: Diagnosis not present

## 2014-01-19 DIAGNOSIS — I272 Other secondary pulmonary hypertension: Secondary | ICD-10-CM | POA: Insufficient documentation

## 2014-01-19 LAB — BASIC METABOLIC PANEL
Anion gap: 14 (ref 5–15)
BUN: 73 mg/dL — ABNORMAL HIGH (ref 6–23)
CALCIUM: 10 mg/dL (ref 8.4–10.5)
CO2: 33 mEq/L — ABNORMAL HIGH (ref 19–32)
CREATININE: 1.48 mg/dL — AB (ref 0.50–1.10)
Chloride: 86 mEq/L — ABNORMAL LOW (ref 96–112)
GFR calc non Af Amer: 29 mL/min — ABNORMAL LOW (ref >90)
GFR, EST AFRICAN AMERICAN: 33 mL/min — AB (ref >90)
Glucose, Bld: 84 mg/dL (ref 70–99)
Potassium: 3.6 mEq/L — ABNORMAL LOW (ref 3.7–5.3)
Sodium: 133 mEq/L — ABNORMAL LOW (ref 137–147)

## 2014-01-19 LAB — CBC
HCT: 40.4 % (ref 36.0–46.0)
HEMOGLOBIN: 13.5 g/dL (ref 12.0–15.0)
MCH: 32.3 pg (ref 26.0–34.0)
MCHC: 33.4 g/dL (ref 30.0–36.0)
MCV: 96.7 fL (ref 78.0–100.0)
Platelets: 168 10*3/uL (ref 150–400)
RBC: 4.18 MIL/uL (ref 3.87–5.11)
RDW: 14.8 % (ref 11.5–15.5)
WBC: 9.4 10*3/uL (ref 4.0–10.5)

## 2014-01-19 NOTE — Progress Notes (Signed)
Patient ID: Janice Petersen, female   DOB: 23-Mar-1915, 80 y.o.   MRN: [id number] PCP: Stephanie Acre  64 yo with history of diastolic CHF, valvular heart disease, CAD, and chronic atrial fibrillation presents for cardiology followup.  She used to live in [facility] and was followed by a cardiologist down there.  She has moved to [facility] to be nearer her family (currently living with one of her sons).  She had a stent placed in her RCA in 2004.  She has had a long history of valvular disease, last echo showed moderate to severe MR and moderate to severe TR with severe pulmonary hypertension.  She has a history of diastolic CHF.  She has been in atrial fibrillation since 2003 on warfarin.  Follow up for Heart Failure: Weight stable since last appointment.  No falls.  No orthopnea or PND.  No chest pain.  No lightheadedness.  She is walking around her house and out in the yard without much dyspnea.  She tires easily.      Labs (2/13): K 4.1, creatinine 1.1 => 1.4, proBNP 683, LDL 54, HDL 44 Labs (4/13): Creatinine 1.87, K 4.9, BNP 486 Labs (6/13): K 3.6, creatinine 1.4 Labs (2/14): K 3.8, creatinine 1.3, BNP 446 Labs (6/14): K 3.6, creatinine 1.4, BNP 733 Labs (8/14): K 3.8, creatinine 1.3, BNP 721, LDL 49, HDl 52 Labs (11/14): K 4.4, creatinine 1.7 => 2.4 => 1.5, BNP 549 Labs (12/14): K 4.3, creatinine 1.5 Labs (4/15): K 3.8, BUN 104, creatinine 2.3 Labs (4/15): K 4.2, BUN 76, creatinine 1.57, pro-BNP 5119 Labs (07/30/13) K 3.3 Creatinine 1.5  Labs (6/15): K 3.1, creatinine 1.41 Labs (11/06/13): K 3.0 Creatinine  1.6 Pro BNP 203  Labs (9/15): K 4.1, creatinine 1.7 => 1.4, BUN 74 => 47, BNP 2598 Labs (10/15): K 5, creatinine 3.76  PMH: 1. Diastolic CHF: Echo (28/31) with EF 65%, mild AI, moderate to severe MR, moderate to severe TR, PA systolic pressure 84 mmHg.  She has been on home oxygen since 1/13.  2. Chronic atrial fibrillation since 2003. 3. PMR 4. H/o appendectomy 5. TAH 6. Renal artery  stenosis: 50% left renal artery stenosis found on angiogram in 8/02.  7. Hypothyroidism 8. MRSA sepsis in 2006 9. Hyperlipidemia 10. H/o PE 11. CAD: Cypher DES to proximal RCA in 2/04.  Last Lexiscan myoview in 2011 showed EF 67%, normal.  12. Valvular heart disease: Last echo in 10/12 showed moderate to severe MR, moderate to severe TR.  13. Pulmonary hypertension: Suspect secondary to elevated left atrial pressure as well as probable reactive pulmonary vascular changes.  14. CKD 15. Squamous cell CA right leg  SH: Widow. Moved from [place] to [place] and living with her son.  Has 3 sons.  Former smoker (quit years ago).  Rare ETOH.   FH: Mother with CHF at 40, father with multiple myeloma.   ROS: All systems reviewed and negative except as per HPI.   Current Outpatient Prescriptions  Medication Sig Dispense Refill  . bisoprolol (ZEBETA) 10 MG tablet TAKE 1 TABLET BY MOUTH EVERY DAY 90 tablet 0  . Calcium Carbonate-Vitamin D (CALCIUM 500 + D PO) Take by mouth daily.    . Coenzyme Q10 (COQ10 PO) Take 400 mg by mouth.    . furosemide (LASIX) 40 MG tablet Take one and one half tabs in the AM and one tab in the PM (Patient taking differently: Take 3 tabs in the AM and 2 tab in the PM) 120  tablet 3  . isosorbide mononitrate (IMDUR) 30 MG 24 hr tablet TAKE ONE TABLET DAILY    . levothyroxine (SYNTHROID, LEVOTHROID) 50 MCG tablet Take 50 mcg by mouth daily before breakfast.    . lovastatin (MEVACOR) 20 MG tablet TAKE 1 TABLET BY MOUTH EVERY DAY 90 tablet 0  . metolazone (ZAROXOLYN) 2.5 MG tablet Take 1 tablet (2.5 mg total) by mouth 3 (three) times a week. Monday, Wednesday and Friday . Hold if your weight is less than 88 lbs. 15 tablet 6  . NON FORMULARY 3 L daily. 3 Liters of oxygen    . potassium chloride (K-DUR,KLOR-CON) 10 MEQ tablet Take 5 tablets (50 mEq total) by mouth 2 (two) times daily. 300 tablet 3  . Probiotic Product (ALIGN PO) Take by mouth daily.    Marland Kitchen warfarin  (COUMADIN) 1 MG tablet TAKE AS DIRECTED BY ANTICOAGULATION CLINIC 60 tablet 3   No current facility-administered medications for this encounter.    BP 116/62 mmHg  Pulse 90  Wt 94 lb 8 oz (42.865 kg)  SpO2 94% General: frail, NAD arrived in a wheelchair.  Neck: JVP 8 cm, no thyromegaly or thyroid nodule.  Lungs: CTAB CV: Nondisplaced PMI.  Heart irregular S1/S2, no S3/S4, 2/6 HSM LLSB/apex.  No edema.  No carotid bruit.   Abdomen: Soft, nontender, no hepatosplenomegaly, mild distention.     Neurologic: Alert and oriented x 3.  Psych: Normal affect. Extremities: No clubbing or cyanosis.   Assessment/Plan:  Atrial fibrillation  Chronic atrial fibrillation.  Continue warfarin. No recent falls.  Check CBC with coumadin use. CAD  Stable with no chest pain. Continue warfarin (no indication to add ASA), statin, beta blocker. Diastolic CHF, chronic  Stable NYHA class III symptoms.  She is not markedly volume overloaded. - Continue Lasix 120 qam, 80 qpm.  Continue metolazone 3 times a week. - BMET today.   Pulmonary hypertension  Probably secondary to diastolic LV failure with elevated LA pressure and reactive pulmonary artery changes.  Valvular heart disease  Moderate to severe MR and TR, likely significant contributors to diastolic CHF and pulmonary HTN. Not an operative candidate.  CKD Creatinine stable at last check.  Will repeat today.   [name] 01/19/2014

## 2014-01-19 NOTE — Patient Instructions (Signed)
Labs today  Your physician recommends that you schedule a follow-up appointment in: 6 weeks with Dr Aundra Dubin

## 2014-01-21 ENCOUNTER — Other Ambulatory Visit: Payer: Self-pay | Admitting: Cardiology

## 2014-01-21 DIAGNOSIS — I5022 Chronic systolic (congestive) heart failure: Secondary | ICD-10-CM

## 2014-01-27 ENCOUNTER — Ambulatory Visit (INDEPENDENT_AMBULATORY_CARE_PROVIDER_SITE_OTHER): Payer: Medicare Other | Admitting: *Deleted

## 2014-01-27 DIAGNOSIS — I4891 Unspecified atrial fibrillation: Secondary | ICD-10-CM

## 2014-01-27 DIAGNOSIS — Z7901 Long term (current) use of anticoagulants: Secondary | ICD-10-CM

## 2014-01-27 DIAGNOSIS — Z5181 Encounter for therapeutic drug level monitoring: Secondary | ICD-10-CM

## 2014-01-27 LAB — POCT INR: INR: 2.5

## 2014-01-28 ENCOUNTER — Other Ambulatory Visit: Payer: Self-pay | Admitting: Cardiology

## 2014-02-15 ENCOUNTER — Other Ambulatory Visit: Payer: Self-pay | Admitting: Cardiology

## 2014-02-17 ENCOUNTER — Telehealth (HOSPITAL_COMMUNITY): Payer: Self-pay | Admitting: Vascular Surgery

## 2014-02-17 NOTE — Telephone Encounter (Signed)
Spoke w/[name] she states pt's scale has not been working so she hasn't weighed since fri or sat and at that time it was 192.5, yesterday was 95 and today again it was 95 lb, she does take lasix 120 mg in AM and 80 mg in PM and Met every Mon, Wed and Fri and she did take it this AM, she states pt seems to get a little SOB with activity, will check wt in the AM and call us back with update

## 2014-02-17 NOTE — Telephone Encounter (Signed)
Pt daughter in law called pt weight is up 5lbs... Her weight usually  90lbs today 95.Marland Kitchen Please advise

## 2014-02-18 MED ORDER — FUROSEMIDE 40 MG PO TABS: ORAL_TABLET | ORAL | Status: DC

## 2014-02-18 NOTE — Telephone Encounter (Signed)
Spoke w/pt, she state wt is down to 94 lbs today, she feels like the metolazone seems to work better the day after she takes it than the day she does take it, she did med yesterday, she is sch to see Dr Aundra Dubin next week, asked about moving up to today however she states is a little hesitant to do that, if wt up tomorrow or pt feels worse she will call back for sooner appt

## 2014-02-23 ENCOUNTER — Telehealth (HOSPITAL_COMMUNITY): Payer: Self-pay | Admitting: Vascular Surgery

## 2014-02-23 NOTE — Telephone Encounter (Signed)
Spoke w/pt's daughter, she states today pt's wt was 91 lb, yesterday she was 95 lb and has been 94-95 for past week, pt did take metolazone yesterday, she takes it [place], wed and fri,.  She states pt is having a strange feeling in her chest that she can only describe as "goose bumps" and it last about 20 sec, it was happening frequently this AM but has slowed down.  Pt is sch to see Dr Aundra Dubin tomorrow and they will just keep that appt

## 2014-02-23 NOTE — Telephone Encounter (Signed)
Pt daughter called .Marland Kitchen Pt has been experiencing what she calls as goose bumps in her chest... She said its no pain but it doesn't feel good either.. She said this sensation lasts about a min at a time.. Please advise

## 2014-02-24 ENCOUNTER — Ambulatory Visit (HOSPITAL_COMMUNITY)
Admission: RE | Admit: 2014-02-24 | Discharge: 2014-02-24 | Disposition: A | Discharge status: Home / Self Care | Payer: Medicare Other | Source: Ambulatory Visit | Attending: Internal Medicine | Admitting: Internal Medicine

## 2014-02-24 ENCOUNTER — Encounter (HOSPITAL_COMMUNITY): Payer: Self-pay | Admitting: Adult Health

## 2014-02-24 VITALS — BP 106/54 | HR 74 | Wt 95.8 lb

## 2014-02-24 DIAGNOSIS — Z87891 Personal history of nicotine dependence: Secondary | ICD-10-CM | POA: Diagnosis not present

## 2014-02-24 DIAGNOSIS — Z7901 Long term (current) use of anticoagulants: Secondary | ICD-10-CM | POA: Diagnosis not present

## 2014-02-24 DIAGNOSIS — I340 Nonrheumatic mitral (valve) insufficiency: Secondary | ICD-10-CM | POA: Insufficient documentation

## 2014-02-24 DIAGNOSIS — R0600 Dyspnea, unspecified: Secondary | ICD-10-CM | POA: Diagnosis not present

## 2014-02-24 DIAGNOSIS — I071 Rheumatic tricuspid insufficiency: Secondary | ICD-10-CM | POA: Insufficient documentation

## 2014-02-24 DIAGNOSIS — I480 Paroxysmal atrial fibrillation: Secondary | ICD-10-CM

## 2014-02-24 DIAGNOSIS — R110 Nausea: Secondary | ICD-10-CM | POA: Insufficient documentation

## 2014-02-24 DIAGNOSIS — Z86711 Personal history of pulmonary embolism: Secondary | ICD-10-CM | POA: Insufficient documentation

## 2014-02-24 DIAGNOSIS — E039 Hypothyroidism, unspecified: Secondary | ICD-10-CM | POA: Diagnosis not present

## 2014-02-24 DIAGNOSIS — N189 Chronic kidney disease, unspecified: Secondary | ICD-10-CM | POA: Insufficient documentation

## 2014-02-24 DIAGNOSIS — I272 Other secondary pulmonary hypertension: Secondary | ICD-10-CM | POA: Insufficient documentation

## 2014-02-24 DIAGNOSIS — I2510 Atherosclerotic heart disease of native coronary artery without angina pectoris: Secondary | ICD-10-CM | POA: Insufficient documentation

## 2014-02-24 DIAGNOSIS — E785 Hyperlipidemia, unspecified: Secondary | ICD-10-CM | POA: Insufficient documentation

## 2014-02-24 DIAGNOSIS — I5032 Chronic diastolic (congestive) heart failure: Secondary | ICD-10-CM

## 2014-02-24 DIAGNOSIS — Z79899 Other long term (current) drug therapy: Secondary | ICD-10-CM | POA: Diagnosis not present

## 2014-02-24 DIAGNOSIS — N183 Chronic kidney disease, stage 3 (moderate): Secondary | ICD-10-CM

## 2014-02-24 DIAGNOSIS — I482 Chronic atrial fibrillation: Secondary | ICD-10-CM | POA: Diagnosis not present

## 2014-02-24 DIAGNOSIS — I129 Hypertensive chronic kidney disease with stage 1 through stage 4 chronic kidney disease, or unspecified chronic kidney disease: Secondary | ICD-10-CM | POA: Insufficient documentation

## 2014-02-24 DIAGNOSIS — I701 Atherosclerosis of renal artery: Secondary | ICD-10-CM | POA: Insufficient documentation

## 2014-02-24 DIAGNOSIS — Z9981 Dependence on supplemental oxygen: Secondary | ICD-10-CM | POA: Diagnosis not present

## 2014-02-24 LAB — BASIC METABOLIC PANEL
Anion gap: 17 — ABNORMAL HIGH (ref 5–15)
BUN: 68 mg/dL — ABNORMAL HIGH (ref 6–23)
CO2: 33 meq/L — AB (ref 19–32)
Calcium: 10 mg/dL (ref 8.4–10.5)
Chloride: 80 mEq/L — ABNORMAL LOW (ref 96–112)
Creatinine, Ser: 1.29 mg/dL — ABNORMAL HIGH (ref 0.50–1.10)
GFR calc Af Amer: 39 mL/min — ABNORMAL LOW (ref >90)
GFR calc non Af Amer: 34 mL/min — ABNORMAL LOW (ref >90)
Glucose, Bld: 179 mg/dL — ABNORMAL HIGH (ref 70–99)
Potassium: 2.6 mEq/L — CL (ref 3.7–5.3)
Sodium: 130 mEq/L — ABNORMAL LOW (ref 137–147)

## 2014-02-24 LAB — PRO B NATRIURETIC PEPTIDE: Pro B Natriuretic peptide (BNP): 2811 pg/mL — ABNORMAL HIGH (ref 0–450)

## 2014-02-24 MED ORDER — POTASSIUM CHLORIDE CRYS ER 10 MEQ PO TBCR: 50.0000 meq | EXTENDED_RELEASE_TABLET | Freq: Two times a day (BID) | ORAL | Status: AC

## 2014-02-24 MED ORDER — FUROSEMIDE 40 MG PO TABS: 120.0000 mg | ORAL_TABLET | Freq: Two times a day (BID) | ORAL | Status: DC

## 2014-02-24 NOTE — Addendum Note (Signed)
Addended by: Scarlette Calico on: 02/24/2014 03:24 PM   Modules accepted: Orders

## 2014-02-24 NOTE — Progress Notes (Signed)
Pt's daughter aware and agreeable

## 2014-02-24 NOTE — Progress Notes (Signed)
Patient ID: Janice Petersen, female   DOB: 16-Aug-1915, 80 y.o.   MRN: [id number]   Critical lab value for potassium 2.6  Please call and ask her to take an extra  40 meq of potassium today and on metolazone days.   Continue 50 meq potassium twice a day.   CLEGG,[name]  1:15 PM

## 2014-02-24 NOTE — Progress Notes (Signed)
Patient ID: Janice Petersen, female   DOB: 1915/05/13, 80 y.o.   MRN: [id number] PCP: Stephanie Acre  22 yo with history of diastolic CHF, valvular heart disease, CAD, and chronic atrial fibrillation presents for cardiology followup.  She used to live in [facility] and was followed by a cardiologist down there.  She has moved to [facility] to be nearer her family (currently living with one of her sons).  She had a stent placed in her RCA in 2004.  She has had a long history of valvular disease, last echo showed moderate to severe MR and moderate to severe TR with severe pulmonary hypertension.  She has a history of diastolic CHF.  She has been in atrial fibrillation since 2003 on warfarin.  Follow up for Heart Failure: Weight is up 1 lb.  She has been having trouble with nausea and does not have much appetite.  She is not walking as much and has had some mild dyspnea walking in her house. No chest pain.  No lightheadedness.    Labs (2/13): K 4.1, creatinine 1.1 => 1.4, proBNP 683, LDL 54, HDL 44 Labs (4/13): Creatinine 1.87, K 4.9, BNP 486 Labs (6/13): K 3.6, creatinine 1.4 Labs (2/14): K 3.8, creatinine 1.3, BNP 446 Labs (6/14): K 3.6, creatinine 1.4, BNP 733 Labs (8/14): K 3.8, creatinine 1.3, BNP 721, LDL 49, HDl 52 Labs (11/14): K 4.4, creatinine 1.7 => 2.4 => 1.5, BNP 549 Labs (12/14): K 4.3, creatinine 1.5 Labs (4/15): K 3.8, BUN 104, creatinine 2.3 Labs (4/15): K 4.2, BUN 76, creatinine 1.57, pro-BNP 5119 Labs (07/30/13) K 3.3 Creatinine 1.5  Labs (6/15): K 3.1, creatinine 1.41 Labs (11/06/13): K 3.0 Creatinine  1.6 Pro BNP 203  Labs (9/15): K 4.1, creatinine 1.7 => 1.4, BUN 74 => 47, BNP 2598 Labs (10/15): K 5, creatinine 1.48 Labs (11/15): K 3.6, creatinine 1.48, HCT 66.5  PMH: 1. Diastolic CHF: Echo (99/35) with EF 65%, mild AI, moderate to severe MR, moderate to severe TR, PA systolic pressure 84 mmHg.  She has been on home oxygen since 1/13.  2. Chronic atrial fibrillation since 2003. 3.  PMR 4. H/o appendectomy 5. TAH 6. Renal artery stenosis: 50% left renal artery stenosis found on angiogram in 8/02.  7. Hypothyroidism 8. MRSA sepsis in 2006 9. Hyperlipidemia 10. H/o PE 11. CAD: Cypher DES to proximal RCA in 2/04.  Last Lexiscan myoview in 2011 showed EF 67%, normal.  12. Valvular heart disease: Last echo in 10/12 showed moderate to severe MR, moderate to severe TR.  13. Pulmonary hypertension: Suspect secondary to elevated left atrial pressure as well as probable reactive pulmonary vascular changes.  14. CKD 15. Squamous cell CA right leg  SH: Widow. Moved from [place] to [place] and living with her son.  Has 3 sons.  Former smoker (quit years ago).  Rare ETOH.   FH: Mother with CHF at 38, father with multiple myeloma.   ROS: All systems reviewed and negative except as per HPI.   Current Outpatient Prescriptions  Medication Sig Dispense Refill  . bisoprolol (ZEBETA) 10 MG tablet TAKE 1 TABLET BY MOUTH EVERY DAY 90 tablet 0  . Calcium Carbonate-Vitamin D (CALCIUM 500 + D PO) Take by mouth daily.    . Coenzyme Q10 (COQ10 PO) Take 400 mg by mouth.    . furosemide (LASIX) 40 MG tablet Take 3 tablets (120 mg total) by mouth 2 (two) times daily. 180 tablet 3  . isosorbide mononitrate (IMDUR) 30 MG  24 hr tablet TAKE ONE TABLET DAILY    . levothyroxine (SYNTHROID, LEVOTHROID) 50 MCG tablet Take 50 mcg by mouth daily before breakfast.    . lovastatin (MEVACOR) 20 MG tablet TAKE 1 TABLET BY MOUTH EVERY DAY 90 tablet 0  . metolazone (ZAROXOLYN) 2.5 MG tablet Take 1 tablet (2.5 mg total) by mouth 3 (three) times a week. Monday, Wednesday and Friday . Hold if your weight is less than 88 lbs. 15 tablet 6  . NON FORMULARY 3 L daily. 3 Liters of oxygen    . Probiotic Product (ALIGN PO) Take by mouth daily.    Marland Kitchen warfarin (COUMADIN) 1 MG tablet TAKE AS DIRECTED BY ANTICOAGULATION CLINIC 60 tablet 3  . potassium chloride (K-DUR,KLOR-CON) 10 MEQ tablet Take 5 tablets (50 mEq  total) by mouth 2 (two) times daily. Take an extra 40 meq when you take Metolazone 300 tablet 3   No current facility-administered medications for this encounter.    BP 106/54 mmHg  Pulse 74  Wt 95 lb 12.8 oz (43.455 kg)  SpO2 92% General: frail, NAD arrived in a wheelchair.  Neck: JVP 12 cm, no thyromegaly or thyroid nodule.  Lungs: CTAB CV: Nondisplaced PMI.  Heart irregular S1/S2, no S3/S4, 2/6 HSM LLSB/apex.  No edema.  No carotid bruit.   Abdomen: Soft, nontender, no hepatosplenomegaly, mild distention.     Neurologic: Alert and oriented x 3.  Psych: Normal affect. Extremities: No clubbing or cyanosis.   Assessment/Plan:  Atrial fibrillation  Chronic atrial fibrillation.  Continue warfarin. No recent falls.   CAD  Stable with no chest pain. Continue warfarin (no indication to add ASA), statin, beta blocker. Diastolic CHF, chronic  NYHA class IIIb symptoms with volume overload on exam. - Increase Lasix to 120 mg bid.  Continue metolazone 3 times a week. - BMET/BNP today and BMET 10 days   Pulmonary hypertension  Probably secondary to diastolic LV failure with elevated LA pressure and reactive pulmonary artery changes.  Valvular heart disease  Moderate to severe MR and TR, likely significant contributors to diastolic CHF and pulmonary HTN. Not an operative candidate.  CKD Creatinine stable at last check.  BMET today.    Followup in 1 month.   [name] 02/24/2014

## 2014-02-24 NOTE — Patient Instructions (Signed)
Increase Furosemide (Lasix) to 120 mg (3 tabs) Twice daily   Labs today  Labs in 10 days (bmet)  Your physician recommends that you schedule a follow-up appointment in: 1 month

## 2014-03-10 ENCOUNTER — Telehealth: Payer: Self-pay

## 2014-03-10 ENCOUNTER — Ambulatory Visit (INDEPENDENT_AMBULATORY_CARE_PROVIDER_SITE_OTHER): Payer: Medicare Other | Admitting: Pharmacist

## 2014-03-10 ENCOUNTER — Other Ambulatory Visit (INDEPENDENT_AMBULATORY_CARE_PROVIDER_SITE_OTHER): Payer: Medicare Other | Admitting: *Deleted

## 2014-03-10 DIAGNOSIS — Z7901 Long term (current) use of anticoagulants: Secondary | ICD-10-CM

## 2014-03-10 DIAGNOSIS — Z5181 Encounter for therapeutic drug level monitoring: Secondary | ICD-10-CM

## 2014-03-10 DIAGNOSIS — I4891 Unspecified atrial fibrillation: Secondary | ICD-10-CM

## 2014-03-10 DIAGNOSIS — I480 Paroxysmal atrial fibrillation: Secondary | ICD-10-CM

## 2014-03-10 DIAGNOSIS — I5032 Chronic diastolic (congestive) heart failure: Secondary | ICD-10-CM

## 2014-03-10 LAB — BASIC METABOLIC PANEL
BUN: 93 mg/dL (ref 6–23)
CHLORIDE: 88 meq/L — AB (ref 96–112)
CO2: 31 mEq/L (ref 19–32)
CREATININE: 1.7 mg/dL — AB (ref 0.4–1.2)
Calcium: 9.6 mg/dL (ref 8.4–10.5)
GFR: 29.36 mL/min — AB (ref >60.00)
Glucose, Bld: 150 mg/dL — ABNORMAL HIGH (ref 70–99)
Potassium: 3.5 mEq/L (ref 3.5–5.1)
Sodium: 132 mEq/L — ABNORMAL LOW (ref 135–145)

## 2014-03-10 LAB — POCT INR: INR: 2.4

## 2014-03-10 MED ORDER — FUROSEMIDE 40 MG PO TABS: 60.0000 mg | ORAL_TABLET | Freq: Every day | ORAL | Status: DC

## 2014-03-10 NOTE — Addendum Note (Signed)
Addended by: Ewell Poe L on: 03/10/2014 05:08 PM   Modules accepted: Orders

## 2014-03-10 NOTE — Telephone Encounter (Signed)
Megan, see my note attached to this patient's labs.  I've outlined what I want her to do with her diuretics.  Please give her a call.

## 2014-03-10 NOTE — Telephone Encounter (Addendum)
[name] from lab called about patient's BUN, which is at 93. Went to Dr. Acie Fredrickson, DOD, he advised to take half dose Lasix once a day for now. Patient informed not to take night dose of Lasix and to only take once daily. Patient verbalized understanding. Will forward to CHF clinic and Dr. Aundra Dubin for further instructions.

## 2014-03-11 ENCOUNTER — Telehealth (HOSPITAL_COMMUNITY): Payer: Self-pay

## 2014-03-11 NOTE — Telephone Encounter (Signed)
Left VM for patient to return call to discuss decreasing lasix to 120mg  in am and 80mg  in pm per Dr. Aundra Dubin.  Will attempt to call back later.

## 2014-03-15 ENCOUNTER — Telehealth (HOSPITAL_COMMUNITY): Payer: Self-pay

## 2014-03-15 MED ORDER — FUROSEMIDE 20 MG PO TABS: ORAL_TABLET | ORAL | Status: DC

## 2014-03-15 NOTE — Telephone Encounter (Signed)
Dr. Aundra Dubin reviewed recent labs on patient that reflected elevated BUN and Cr.  Patient's med list showed lasix 60mg  once daily, however patient states she was taking 3 20mg  tablets (60mg ) twice daily.  Dr. Aundra Dubin isntructed to decrease afternoon dose to 40mg  and recheck labs in 7-10 days.  Patient aware and agreeable.  Renee Pain

## 2014-03-31 ENCOUNTER — Ambulatory Visit (HOSPITAL_COMMUNITY)
Admission: RE | Admit: 2014-03-31 | Discharge: 2014-03-31 | Disposition: A | Discharge status: Home / Self Care | Payer: Medicare Other | Source: Ambulatory Visit | Attending: Internal Medicine | Admitting: Internal Medicine

## 2014-03-31 ENCOUNTER — Encounter (HOSPITAL_COMMUNITY): Payer: Self-pay

## 2014-03-31 VITALS — BP 105/64 | HR 92 | Resp 18 | Wt 95.0 lb

## 2014-03-31 DIAGNOSIS — E039 Hypothyroidism, unspecified: Secondary | ICD-10-CM | POA: Diagnosis not present

## 2014-03-31 DIAGNOSIS — I701 Atherosclerosis of renal artery: Secondary | ICD-10-CM | POA: Diagnosis not present

## 2014-03-31 DIAGNOSIS — Z87891 Personal history of nicotine dependence: Secondary | ICD-10-CM | POA: Insufficient documentation

## 2014-03-31 DIAGNOSIS — I38 Endocarditis, valve unspecified: Secondary | ICD-10-CM

## 2014-03-31 DIAGNOSIS — Z79899 Other long term (current) drug therapy: Secondary | ICD-10-CM | POA: Insufficient documentation

## 2014-03-31 DIAGNOSIS — Z7901 Long term (current) use of anticoagulants: Secondary | ICD-10-CM | POA: Insufficient documentation

## 2014-03-31 DIAGNOSIS — N189 Chronic kidney disease, unspecified: Secondary | ICD-10-CM | POA: Diagnosis not present

## 2014-03-31 DIAGNOSIS — I129 Hypertensive chronic kidney disease with stage 1 through stage 4 chronic kidney disease, or unspecified chronic kidney disease: Secondary | ICD-10-CM | POA: Insufficient documentation

## 2014-03-31 DIAGNOSIS — I482 Chronic atrial fibrillation, unspecified: Secondary | ICD-10-CM

## 2014-03-31 DIAGNOSIS — E785 Hyperlipidemia, unspecified: Secondary | ICD-10-CM | POA: Insufficient documentation

## 2014-03-31 DIAGNOSIS — I272 Other secondary pulmonary hypertension: Secondary | ICD-10-CM | POA: Insufficient documentation

## 2014-03-31 DIAGNOSIS — I5032 Chronic diastolic (congestive) heart failure: Secondary | ICD-10-CM | POA: Insufficient documentation

## 2014-03-31 DIAGNOSIS — I2510 Atherosclerotic heart disease of native coronary artery without angina pectoris: Secondary | ICD-10-CM | POA: Insufficient documentation

## 2014-03-31 NOTE — Patient Instructions (Signed)
Follow up in 6 weeks  Do the following things EVERYDAY: 1) Weigh yourself in the morning before breakfast. Write it down and keep it in a log. 2) Take your medicines as prescribed 3) Eat low salt foods-Limit salt (sodium) to 2000 mg per day.  4) Stay as active as you can everyday 5) Limit all fluids for the day to less than 2 liters 

## 2014-03-31 NOTE — Progress Notes (Signed)
Patient ID: Janice Petersen, female   DOB: 02/14/1916, 81 y.o.   MRN: [id number] PCP: Stephanie Acre  Janice Petersen is a 81 yo with history of diastolic CHF, valvular heart disease, CAD, and chronic atrial fibrillation presents for cardiology followup.  She used to live in [facility] and was followed by a cardiologist down there.  She has moved to [place] [facility] [place] to be nearer her family (currently living with one of her sons).  She had a stent placed in her RCA in 2004.  She has had a long history of valvular disease, last echo showed moderate to severe MR and moderate to severe TR with severe pulmonary hypertension.  She has a history of diastolic CHF.  She has been in atrial fibrillation since 2003 on warfarin.  Follow up for Heart Failure:  Over all not feeling pretty good. Last visit lasix was incresed to 120 mg twice a day. Weight at home 91-92 pounds. Productive cough occasionally  with yellow sputum. No fever or chills. Appetite poor. Drinks 3 ensure a day. Taking all medications. Not on coumadin due to falls.   Labs (2/13): K 4.1, creatinine 1.1 => 1.4, proBNP 683, LDL 54, HDL 44 Labs (4/13): Creatinine 1.87, K 4.9, BNP 486 Labs (6/13): K 3.6, creatinine 1.4 Labs (2/14): K 3.8, creatinine 1.3, BNP 446 Labs (6/14): K 3.6, creatinine 1.4, BNP 733 Labs (8/14): K 3.8, creatinine 1.3, BNP 721, LDL 49, HDl 52 Labs (11/14): K 4.4, creatinine 1.7 => 2.4 => 1.5, BNP 549 Labs (12/14): K 4.3, creatinine 1.5 Labs (4/15): K 3.8, BUN 104, creatinine 2.3 Labs (4/15): K 4.2, BUN 76, creatinine 1.57, pro-BNP 5119 Labs (07/30/13) K 3.3 Creatinine 1.5  Labs (6/15): K 3.1, creatinine 1.41 Labs (11/06/13): K 3.0 Creatinine  1.6 Pro BNP 203  Labs (9/15): K 4.1, creatinine 1.7 => 1.4, BUN 74 => 47, BNP 2598 Labs (10/15): K 5, creatinine 1.48 Labs (11/15): K 3.6, creatinine 1.48, HCT 55.7  PMH: 1. Diastolic CHF: Echo (32/20) with EF 65%, mild AI, moderate to severe MR, moderate to severe TR, PA systolic pressure 84 mmHg.  She  has been on home oxygen since 1/13.  2. Chronic atrial fibrillation since 2003. 3. PMR 4. H/o appendectomy 5. TAH 6. Renal artery stenosis: 50% left renal artery stenosis found on angiogram in 8/02.  7. Hypothyroidism 8. MRSA sepsis in 2006 9. Hyperlipidemia 10. H/o PE 11. CAD: Cypher DES to proximal RCA in 2/04.  Last Lexiscan myoview in 2011 showed EF 67%, normal.  12. Valvular heart disease: Last echo in 10/12 showed moderate to severe MR, moderate to severe TR.  13. Pulmonary hypertension: Suspect secondary to elevated left atrial pressure as well as probable reactive pulmonary vascular changes.  14. CKD 15. Squamous cell CA right leg  SH: Widow. Moved from [place] to [place] and living with her son.  Has 3 sons.  Former smoker (quit years ago).  Rare ETOH.   FH: Mother with CHF at 91, father with multiple myeloma.   ROS: All systems reviewed and negative except as per HPI.   Current Outpatient Prescriptions  Medication Sig Dispense Refill  . bisoprolol (ZEBETA) 10 MG tablet TAKE 1 TABLET BY MOUTH EVERY DAY 90 tablet 0  . Calcium Carbonate-Vitamin D (CALCIUM 500 + D PO) Take by mouth daily.    . Coenzyme Q10 (COQ10 PO) Take 400 mg by mouth.    . furosemide (LASIX) 20 MG tablet Take 3 tabs (43m) in am and 2 tabs (427m in  pm (Patient taking differently: 40 mg. Take 3 tabs (138m) in am and 2 tabs (822m in pm) 150 tablet 3  . isosorbide mononitrate (IMDUR) 30 MG 24 hr tablet TAKE ONE TABLET DAILY    . levothyroxine (SYNTHROID, LEVOTHROID) 50 MCG tablet Take 50 mcg by mouth daily before breakfast.    . lovastatin (MEVACOR) 20 MG tablet TAKE 1 TABLET BY MOUTH EVERY DAY 90 tablet 0  . metolazone (ZAROXOLYN) 2.5 MG tablet Take 1 tablet (2.5 mg total) by mouth 3 (three) times a week. Monday, Wednesday and Friday . Hold if your weight is less than 88 lbs. 15 tablet 6  . NON FORMULARY 3 L daily. 3 Liters of oxygen    . potassium chloride (K-DUR,KLOR-CON) 10 MEQ tablet Take 5  tablets (50 mEq total) by mouth 2 (two) times daily. Take an extra 40 meq when you take Metolazone 300 tablet 3  . Probiotic Product (ALIGN PO) Take by mouth daily.    . Marland Kitchenarfarin (COUMADIN) 1 MG tablet TAKE AS DIRECTED BY ANTICOAGULATION CLINIC 60 tablet 3   No current facility-administered medications for this encounter.    BP 105/64 mmHg  Pulse 92  Resp 18  Wt 95 lb (43.092 kg)  SpO2 94% General: frail, NAD arrived in a wheelchair with her son Neck: JVP 7-8 cm, no thyromegaly or thyroid nodule.  Lungs: Clear.  On home oxygen CV: Nondisplaced PMI.  Heart irregular S1/S2, no S3/S4, 2/6 HSM LLSB/apex.  No edema.  No carotid bruit.   Abdomen: Soft, nontender, no hepatosplenomegaly, mild distention.     Neurologic: Alert and oriented x 3.  Psych: Normal affect. Extremities: No clubbing or cyanosis.   Assessment/Plan:  1. Atrial fibrillation  Chronic atrial fibrillation.  Continue warfarin. No recent falls.   2. CAD  Stable with no chest pain. Continue warfarin (no indication to add ASA), statin, beta blocker. Diastolic CHF, chronic  NYHA class IIIb symptoms with stable volume status.  - Continue Lasix to 120 mg bid.  Continue metolazone 3 times a week. Pulmonary hypertension  Probably secondary to diastolic LV failure with elevated LA pressure and reactive pulmonary artery changes.  Valvular heart disease  Moderate to severe MR and TR, likely significant contributors to diastolic CHF and pulmonary HTN. Not an operative candidate.  CKD Creatinine stable at last check.     Follow up in 6 weeks.    CLEGG,AMY  NP-C   03/31/2014

## 2014-04-09 ENCOUNTER — Telehealth (HOSPITAL_COMMUNITY): Payer: Self-pay | Admitting: *Deleted

## 2014-04-09 MED ORDER — FUROSEMIDE 20 MG PO TABS: 120.0000 mg | ORAL_TABLET | Freq: Two times a day (BID) | ORAL | Status: DC

## 2014-04-09 NOTE — Telephone Encounter (Signed)
Spoke w/pt, she states her wt has continued to gradually increase, she states she use to be on lasix 120 mg Twice daily but it was decreased to 120 mg in Am and 80 mg in PM and wt has never been back to 93 lb since that time, she would like to increase dose back up, spoke w/Amy Ninfa Meeker, NP she states ok for pt to increase back to 120 mg Twice daily, pt is aware and agreeable, she will let us know if wt doesn't come down

## 2014-04-15 ENCOUNTER — Other Ambulatory Visit (HOSPITAL_COMMUNITY): Payer: Self-pay | Admitting: *Deleted

## 2014-04-15 DIAGNOSIS — I5022 Chronic systolic (congestive) heart failure: Secondary | ICD-10-CM

## 2014-04-15 MED ORDER — LOVASTATIN 20 MG PO TABS: 20.0000 mg | ORAL_TABLET | Freq: Every day | ORAL | Status: DC

## 2014-04-21 ENCOUNTER — Ambulatory Visit (INDEPENDENT_AMBULATORY_CARE_PROVIDER_SITE_OTHER): Payer: Medicare Other | Admitting: *Deleted

## 2014-04-21 DIAGNOSIS — Z5181 Encounter for therapeutic drug level monitoring: Secondary | ICD-10-CM

## 2014-04-21 DIAGNOSIS — Z7901 Long term (current) use of anticoagulants: Secondary | ICD-10-CM

## 2014-04-21 DIAGNOSIS — I4891 Unspecified atrial fibrillation: Secondary | ICD-10-CM

## 2014-04-21 LAB — POCT INR: INR: 3.9

## 2014-04-26 ENCOUNTER — Other Ambulatory Visit (HOSPITAL_COMMUNITY): Payer: Self-pay | Admitting: Cardiology

## 2014-05-02 ENCOUNTER — Other Ambulatory Visit: Payer: Self-pay | Admitting: Cardiology

## 2014-05-03 ENCOUNTER — Other Ambulatory Visit (HOSPITAL_COMMUNITY): Payer: Self-pay | Admitting: Cardiology

## 2014-05-03 MED ORDER — BISOPROLOL FUMARATE 10 MG PO TABS: 10.0000 mg | ORAL_TABLET | Freq: Every day | ORAL | Status: DC

## 2014-05-05 ENCOUNTER — Ambulatory Visit (INDEPENDENT_AMBULATORY_CARE_PROVIDER_SITE_OTHER): Payer: Medicare Other | Admitting: *Deleted

## 2014-05-05 DIAGNOSIS — I4891 Unspecified atrial fibrillation: Secondary | ICD-10-CM

## 2014-05-05 DIAGNOSIS — Z5181 Encounter for therapeutic drug level monitoring: Secondary | ICD-10-CM

## 2014-05-05 DIAGNOSIS — Z7901 Long term (current) use of anticoagulants: Secondary | ICD-10-CM

## 2014-05-05 LAB — POCT INR: INR: 3

## 2014-05-12 ENCOUNTER — Encounter (HOSPITAL_COMMUNITY): Payer: Medicare Other

## 2014-05-19 ENCOUNTER — Encounter (HOSPITAL_COMMUNITY): Payer: Self-pay

## 2014-05-19 ENCOUNTER — Ambulatory Visit (HOSPITAL_COMMUNITY)
Admission: RE | Admit: 2014-05-19 | Discharge: 2014-05-19 | Disposition: A | Discharge status: Home / Self Care | Payer: Medicare Other | Source: Ambulatory Visit | Attending: Internal Medicine | Admitting: Internal Medicine

## 2014-05-19 VITALS — BP 100/62 | HR 93 | Wt 94.8 lb

## 2014-05-19 DIAGNOSIS — I4891 Unspecified atrial fibrillation: Secondary | ICD-10-CM | POA: Insufficient documentation

## 2014-05-19 DIAGNOSIS — I482 Chronic atrial fibrillation, unspecified: Secondary | ICD-10-CM

## 2014-05-19 DIAGNOSIS — I272 Other secondary pulmonary hypertension: Secondary | ICD-10-CM | POA: Insufficient documentation

## 2014-05-19 DIAGNOSIS — I5032 Chronic diastolic (congestive) heart failure: Secondary | ICD-10-CM

## 2014-05-19 DIAGNOSIS — N184 Chronic kidney disease, stage 4 (severe): Secondary | ICD-10-CM | POA: Diagnosis not present

## 2014-05-19 DIAGNOSIS — I2510 Atherosclerotic heart disease of native coronary artery without angina pectoris: Secondary | ICD-10-CM | POA: Diagnosis not present

## 2014-05-19 DIAGNOSIS — I38 Endocarditis, valve unspecified: Secondary | ICD-10-CM | POA: Diagnosis not present

## 2014-05-19 LAB — BASIC METABOLIC PANEL
ANION GAP: 9 (ref 5–15)
BUN: 123 mg/dL — ABNORMAL HIGH (ref 6–23)
CALCIUM: 9.1 mg/dL (ref 8.4–10.5)
CO2: 35 mmol/L — ABNORMAL HIGH (ref 19–32)
Chloride: 91 mmol/L — ABNORMAL LOW (ref 96–112)
Creatinine, Ser: 1.75 mg/dL — ABNORMAL HIGH (ref 0.50–1.10)
GFR calc Af Amer: 27 mL/min — ABNORMAL LOW (ref >90)
GFR calc non Af Amer: 23 mL/min — ABNORMAL LOW (ref >90)
Glucose, Bld: 79 mg/dL (ref 70–99)
Potassium: 4.5 mmol/L (ref 3.5–5.1)
Sodium: 135 mmol/L (ref 135–145)

## 2014-05-19 MED ORDER — METOLAZONE 2.5 MG PO TABS: 2.5000 mg | ORAL_TABLET | ORAL | Status: DC

## 2014-05-19 NOTE — Addendum Note (Signed)
Encounter addended by: Effie Berkshire, RN on: 05/19/2014  3:40 PM<BR>     Documentation filed: Dx Association, Patient Instructions Section, Orders

## 2014-05-19 NOTE — Progress Notes (Signed)
Patient ID: Janice Petersen, female   DOB: 06-Sep-1915, 81 y.o.   MRN: [id number] PCP: Stephanie Acre  Janice Petersen is a 4 yo with history of diastolic CHF, valvular heart disease, CAD, and chronic atrial fibrillation presents for cardiology followup.  She used to live in [facility] and was followed by a cardiologist down there.  She has moved to [facility] to be nearer her family (currently living with one of her sons).  She had a stent placed in her RCA in 2004.  She has had a long history of valvular disease, last echo showed moderate to severe MR and moderate to severe TR with severe pulmonary hypertension.  She has a history of diastolic CHF.  She has been in atrial fibrillation since 2003 on warfarin.  Follow up for Heart Failure:  Takes lasix 120 bid and metolazone 3x/week. Says last week she had fluid in her gut and was unable to hold food down. Says she doesn't think she has GI bug. Weight was upt to 95 and now back down to baseline 91-92 pounds. Edema improved. Taking all medications. INR has been up recently   Labs (2/13): K 4.1, creatinine 1.1 => 1.4, proBNP 683, LDL 54, HDL 44 Labs (4/13): Creatinine 1.87, K 4.9, BNP 486 Labs (6/13): K 3.6, creatinine 1.4 Labs (2/14): K 3.8, creatinine 1.3, BNP 446 Labs (6/14): K 3.6, creatinine 1.4, BNP 733 Labs (8/14): K 3.8, creatinine 1.3, BNP 721, LDL 49, HDl 52 Labs (11/14): K 4.4, creatinine 1.7 => 2.4 => 1.5, BNP 549 Labs (12/14): K 4.3, creatinine 1.5 Labs (4/15): K 3.8, BUN 104, creatinine 2.3 Labs (4/15): K 4.2, BUN 76, creatinine 1.57, pro-BNP 5119 Labs (07/30/13) K 3.3 Creatinine 1.5  Labs (6/15): K 3.1, creatinine 1.41 Labs (11/06/13): K 3.0 Creatinine  1.6 Pro BNP 203  Labs (9/15): K 4.1, creatinine 1.7 => 1.4, BUN 74 => 47, BNP 2598 Labs (10/15): K 5, creatinine 1.48 Labs (11/15): K 3.6, creatinine 1.48, HCT 40.4 Labs (10/15): K 3.5, creatinine 1.7  PMH: 1. Diastolic CHF: Echo (95/62) with EF 65%, mild AI, moderate to severe MR, moderate to  severe TR, PA systolic pressure 84 mmHg.  She has been on home oxygen since 1/13.  2. Chronic atrial fibrillation since 2003. 3. PMR 4. H/o appendectomy 5. TAH 6. Renal artery stenosis: 50% left renal artery stenosis found on angiogram in 8/02.  7. Hypothyroidism 8. MRSA sepsis in 2006 9. Hyperlipidemia 10. H/o PE 11. CAD: Cypher DES to proximal RCA in 2/04.  Last Lexiscan myoview in 2011 showed EF 67%, normal.  12. Valvular heart disease: Last echo in 10/12 showed moderate to severe MR, moderate to severe TR.  13. Pulmonary hypertension: Suspect secondary to elevated left atrial pressure as well as probable reactive pulmonary vascular changes.  14. CKD 15. Squamous cell CA right leg  SH: Widow. [place] living [place] son.  Has 3 sons.  Former smoker (quit years ago).  Rare ETOH.   FH: Mother with CHF at 20, father with multiple myeloma.   ROS: All systems reviewed and negative except as per HPI.   Current Outpatient Prescriptions  Medication Sig Dispense Refill  . bisoprolol (ZEBETA) 10 MG tablet Take 1 tablet (10 mg total) by mouth daily. 90 tablet 0  . Calcium Carbonate-Vitamin D (CALCIUM 500 + D PO) Take by mouth daily.    . Coenzyme Q10 (COQ10 PO) Take 400 mg by mouth.    . furosemide (LASIX) 40  MG tablet Take 3 tablets (120 mg total) by mouth 2 (two) times daily. 180 tablet 3  . isosorbide mononitrate (IMDUR) 30 MG 24 hr tablet TAKE ONE TABLET DAILY    . KLOR-CON M10 10 MEQ tablet TAKE 5 TABLETS BY MOUTH TWICE A DAY 300 tablet 3  . levothyroxine (SYNTHROID, LEVOTHROID) 50 MCG tablet Take 50 mcg by mouth daily before breakfast.    . lovastatin (MEVACOR) 20 MG tablet Take 1 tablet (20 mg total) by mouth daily. 90 tablet 3  . metolazone (ZAROXOLYN) 2.5 MG tablet Take 1 tablet (2.5 mg total) by mouth 3 (three) times a week. Monday, Wednesday and Friday . Hold if your weight is less than 88 lbs. 15 tablet 6  . NON FORMULARY 3 L daily. 3 Liters of  oxygen    . potassium chloride (K-DUR,KLOR-CON) 10 MEQ tablet Take 5 tablets (50 mEq total) by mouth 2 (two) times daily. Take an extra 40 meq when you take Metolazone 300 tablet 3  . Probiotic Product (ALIGN PO) Take by mouth daily.    Marland Kitchen warfarin (COUMADIN) 1 MG tablet TAKE AS DIRECTED BY ANTICOAGULATION CLINIC 60 tablet 3   No current facility-administered medications for this encounter.    BP 100/62 mmHg  Pulse 93  Wt 94 lb 12 oz (42.978 kg)  SpO2 95% General: frail, NAD arrived in a wheelchair with her son Neck: JVP hard to see as neck stiff looks like 9 cm, no thyromegaly or thyroid nodule.  Lungs: Clear.  On home oxygen CV: Nondisplaced PMI.  Heart irregular S1/S2, no S3/S4, 2/6 HSM LLSB/apex.  No edema.  No carotid bruit.   Abdomen: Soft, nontender, no hepatosplenomegaly, mild distention.     Neurologic: Alert and oriented x 3.  Psych: Normal affect. Extremities: No clubbing or cyanosis.   Assessment/Plan:  1. Atrial fibrillation  Chronic atrial fibrillation.  Continue warfarin. No recent falls.   2. CAD  Stable with no chest pain. Continue warfarin (no indication to add ASA), statin, beta blocker. Diastolic CHF, chronic  NYHA class IIIb symptoms. Volume status slightly up.  - Continue Lasix to 120 mg bid.  Will increase metolazone slightly from 3x/week to every other day; renal function permitting. Can take extra dose of metolazone if weight up to 95 pounds on off day.  Pulmonary hypertension  Probably secondary to diastolic LV failure with elevated LA pressure and reactive pulmonary artery changes.  Valvular heart disease  Moderate to severe MR and TR, likely significant contributors to diastolic CHF and pulmonary HTN. Not an operative candidate.  CKD Creatinine slightly up. Will recheck today.      Glori Bickers  MD  05/19/2014

## 2014-05-19 NOTE — Patient Instructions (Signed)
INCREASE Metolazone to every other day.  Return in 2 weeks for repeat lab work.  Follow up 2 months.  Do the following things EVERYDAY: 1) Weigh yourself in the morning before breakfast. Write it down and keep it in a log. 2) Take your medicines as prescribed 3) Eat low salt foods-Limit salt (sodium) to 2000 mg per day.  4) Stay as active as you can everyday 5) Limit all fluids for the day to less than 2 liters

## 2014-05-24 ENCOUNTER — Telehealth (HOSPITAL_COMMUNITY): Payer: Self-pay

## 2014-05-24 DIAGNOSIS — I5032 Chronic diastolic (congestive) heart failure: Secondary | ICD-10-CM

## 2014-05-24 MED ORDER — METOLAZONE 2.5 MG PO TABS: 2.5000 mg | ORAL_TABLET | ORAL | Status: DC

## 2014-05-24 NOTE — Telephone Encounter (Signed)
Patient called c/o kidney pain since increasing metolazone to every other day from TIW.  Weight down to 89 lbs.  Advised to go back to TIW metolazone where she better tolerated it.  Aware and agreeable.  Renee Pain

## 2014-05-26 ENCOUNTER — Ambulatory Visit (INDEPENDENT_AMBULATORY_CARE_PROVIDER_SITE_OTHER): Payer: Medicare Other | Admitting: *Deleted

## 2014-05-26 DIAGNOSIS — Z5181 Encounter for therapeutic drug level monitoring: Secondary | ICD-10-CM | POA: Diagnosis not present

## 2014-05-26 DIAGNOSIS — Z7901 Long term (current) use of anticoagulants: Secondary | ICD-10-CM

## 2014-05-26 DIAGNOSIS — I482 Chronic atrial fibrillation, unspecified: Secondary | ICD-10-CM

## 2014-05-26 DIAGNOSIS — I4891 Unspecified atrial fibrillation: Secondary | ICD-10-CM

## 2014-05-26 LAB — POCT INR: INR: 3.3

## 2014-06-02 ENCOUNTER — Ambulatory Visit (HOSPITAL_COMMUNITY)
Admission: RE | Admit: 2014-06-02 | Discharge: 2014-06-02 | Disposition: A | Discharge status: Home / Self Care | Payer: Medicare Other | Source: Ambulatory Visit | Attending: Cardiology | Admitting: Cardiology

## 2014-06-02 DIAGNOSIS — I5022 Chronic systolic (congestive) heart failure: Secondary | ICD-10-CM

## 2014-06-02 DIAGNOSIS — I5032 Chronic diastolic (congestive) heart failure: Secondary | ICD-10-CM | POA: Insufficient documentation

## 2014-06-02 LAB — BASIC METABOLIC PANEL
Anion gap: 10 (ref 5–15)
BUN: 123 mg/dL — ABNORMAL HIGH (ref 6–23)
CO2: 33 mmol/L — AB (ref 19–32)
Calcium: 9.3 mg/dL (ref 8.4–10.5)
Chloride: 91 mmol/L — ABNORMAL LOW (ref 96–112)
Creatinine, Ser: 1.77 mg/dL — ABNORMAL HIGH (ref 0.50–1.10)
GFR calc Af Amer: 27 mL/min — ABNORMAL LOW (ref >90)
GFR, EST NON AFRICAN AMERICAN: 23 mL/min — AB (ref >90)
GLUCOSE: 175 mg/dL — AB (ref 70–99)
Potassium: 3.9 mmol/L (ref 3.5–5.1)
SODIUM: 134 mmol/L — AB (ref 135–145)

## 2014-06-11 IMAGING — CR DG HIP COMPLETE 2+V*R*
3 series · 3 of 3 positions shown · non-contrast
Comparison: None.

CLINICAL DATA: Fall. Right lateral hip pain.

EXAM:
RIGHT HIP - COMPLETE 2+ VIEW

[x pelvis]
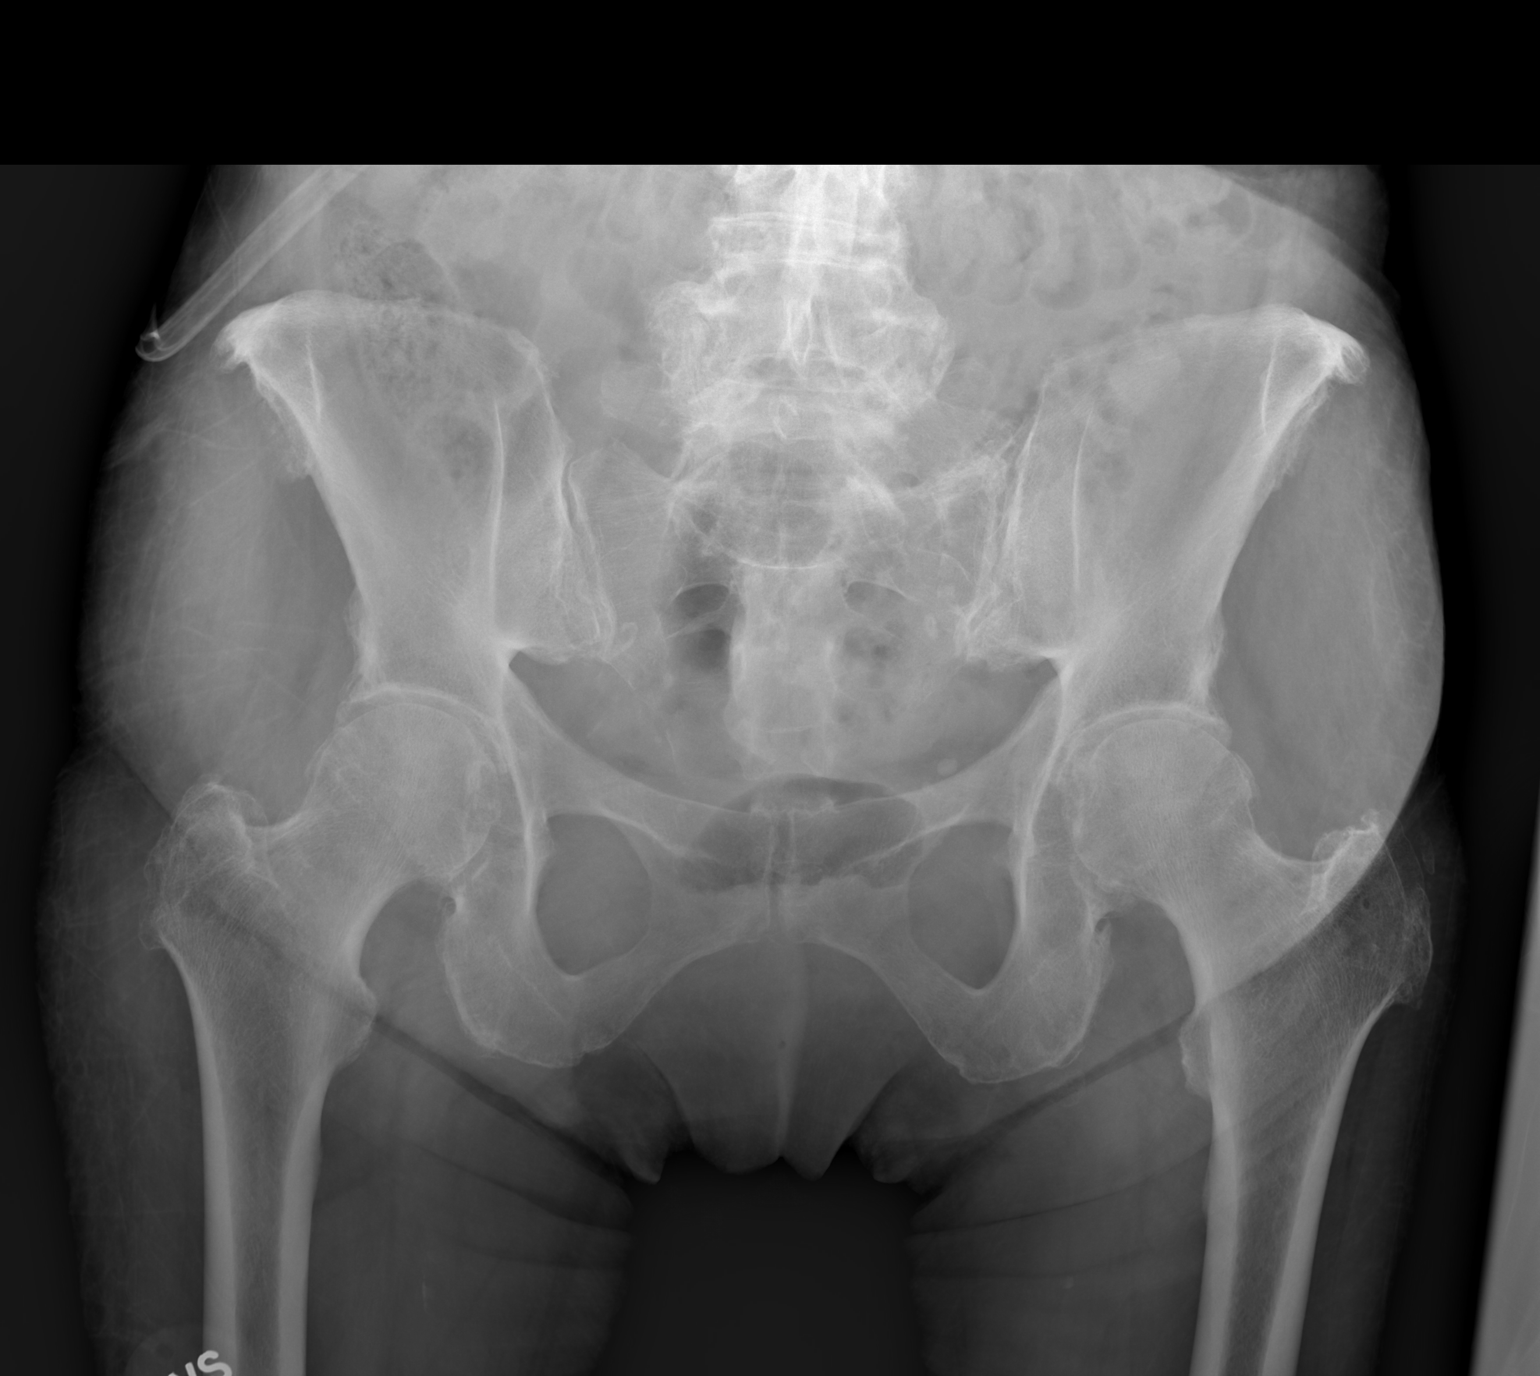

[x hip ap right]
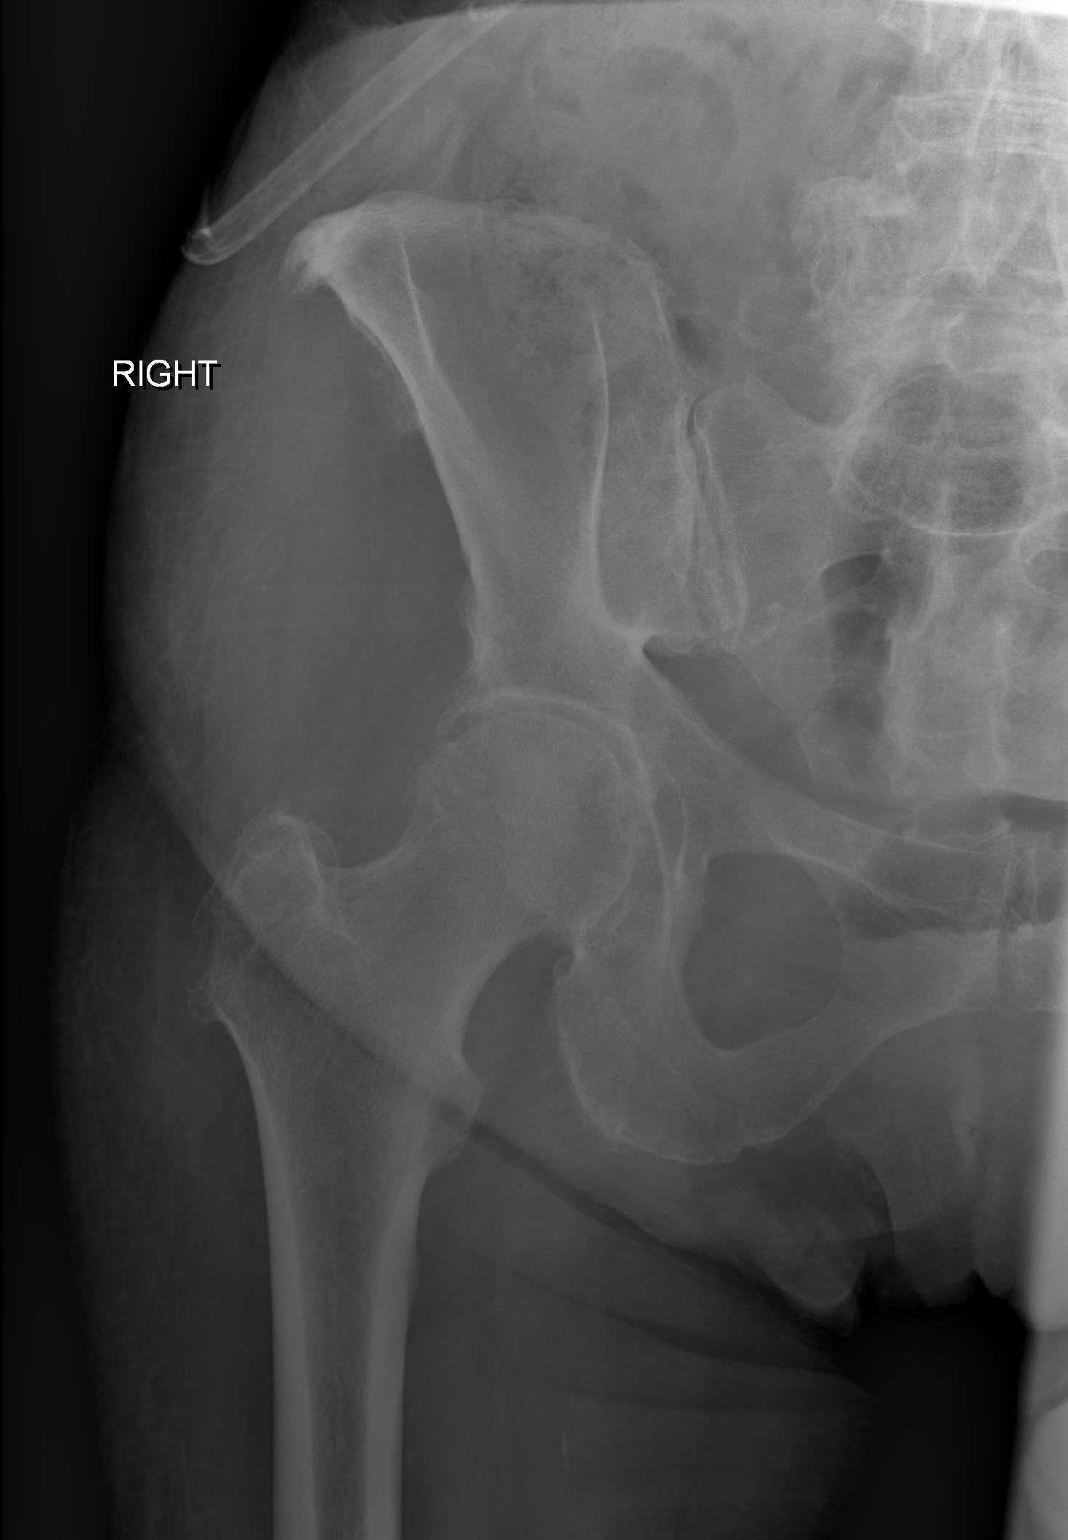

[x hip lat right]
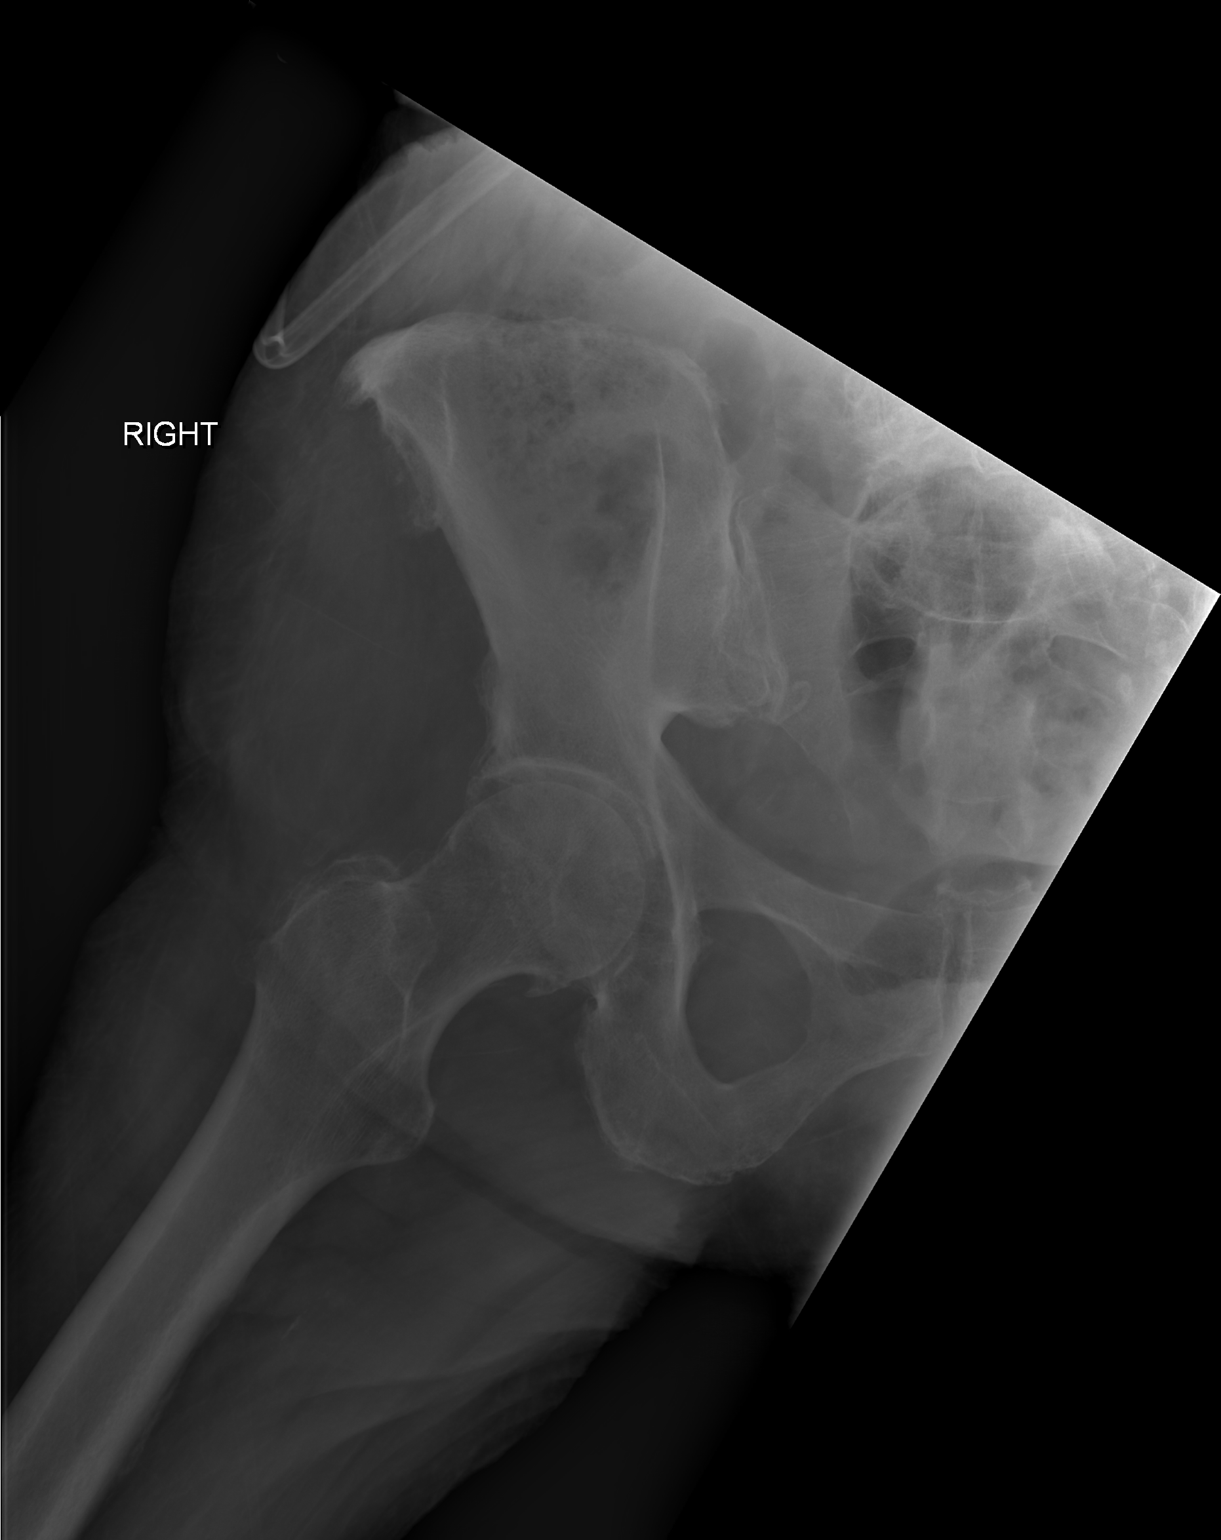

[3 of 3 positions shown; findings below may reference images not displayed]

FINDINGS: No acute or focal bony or joint abnormality is identified. The
patient has marked bilateral hip osteoarthritis which appears fairly
symmetric. Lower lumbar spondylosis is also identified. There is
degenerative change about the sacroiliac joints. Transitional
anatomy at the lumbosacral junction is noted.
IMPRESSION: No acute or focal abnormality.

Moderate to advanced bilateral hip osteoarthritis appears fairly
symmetric.

Lower lumbar spondylosis.

## 2014-06-14 ENCOUNTER — Ambulatory Visit (INDEPENDENT_AMBULATORY_CARE_PROVIDER_SITE_OTHER): Payer: Medicare Other

## 2014-06-14 DIAGNOSIS — Z5181 Encounter for therapeutic drug level monitoring: Secondary | ICD-10-CM

## 2014-06-14 DIAGNOSIS — Z7901 Long term (current) use of anticoagulants: Secondary | ICD-10-CM | POA: Diagnosis not present

## 2014-06-14 DIAGNOSIS — I4891 Unspecified atrial fibrillation: Secondary | ICD-10-CM

## 2014-06-14 LAB — POCT INR: INR: 3.5

## 2014-06-20 ENCOUNTER — Other Ambulatory Visit (HOSPITAL_COMMUNITY): Payer: Self-pay | Admitting: Cardiology

## 2014-06-20 DIAGNOSIS — I5022 Chronic systolic (congestive) heart failure: Secondary | ICD-10-CM

## 2014-06-22 ENCOUNTER — Other Ambulatory Visit (HOSPITAL_COMMUNITY): Payer: Self-pay | Admitting: Cardiology

## 2014-06-28 ENCOUNTER — Ambulatory Visit (INDEPENDENT_AMBULATORY_CARE_PROVIDER_SITE_OTHER): Payer: Medicare Other | Admitting: *Deleted

## 2014-06-28 DIAGNOSIS — Z5181 Encounter for therapeutic drug level monitoring: Secondary | ICD-10-CM | POA: Diagnosis not present

## 2014-06-28 DIAGNOSIS — I4891 Unspecified atrial fibrillation: Secondary | ICD-10-CM

## 2014-06-28 DIAGNOSIS — Z7901 Long term (current) use of anticoagulants: Secondary | ICD-10-CM

## 2014-06-28 LAB — POCT INR: INR: 3.3

## 2014-06-29 ENCOUNTER — Other Ambulatory Visit (HOSPITAL_COMMUNITY): Payer: Self-pay | Admitting: *Deleted

## 2014-06-29 MED ORDER — ALBUTEROL SULFATE HFA 108 (90 BASE) MCG/ACT IN AERS: 2.0000 | INHALATION_SPRAY | Freq: Four times a day (QID) | RESPIRATORY_TRACT | Status: AC | PRN

## 2014-06-29 NOTE — Telephone Encounter (Signed)
Filled pts pro air with no refills and told her it needs to come from her pcp from now on. (per Dr.Mclean)

## 2014-07-12 ENCOUNTER — Telehealth (HOSPITAL_COMMUNITY): Payer: Self-pay | Admitting: *Deleted

## 2014-07-12 ENCOUNTER — Ambulatory Visit (INDEPENDENT_AMBULATORY_CARE_PROVIDER_SITE_OTHER): Payer: Medicare Other | Admitting: *Deleted

## 2014-07-12 DIAGNOSIS — I4891 Unspecified atrial fibrillation: Secondary | ICD-10-CM | POA: Diagnosis not present

## 2014-07-12 DIAGNOSIS — Z7901 Long term (current) use of anticoagulants: Secondary | ICD-10-CM | POA: Diagnosis not present

## 2014-07-12 DIAGNOSIS — Z5181 Encounter for therapeutic drug level monitoring: Secondary | ICD-10-CM | POA: Diagnosis not present

## 2014-07-12 LAB — POCT INR: INR: 2.6

## 2014-07-12 NOTE — Telephone Encounter (Signed)
Pts daughter called and said pts weight is up 3lbs she took an extra metolazone this morning. Pt is also wheezing wants to know if she can take mucinex and increase her oxygen from 3L to 4L. Pt has an appt    Called kathy to let her know it was ok for pt to take mucinex and to increase her oxygen.  Also follow up with pulm. (per Jinny Blossom) Juliann Pulse verbalized she understood.

## 2014-07-13 ENCOUNTER — Telehealth (HOSPITAL_COMMUNITY): Payer: Self-pay | Admitting: Cardiology

## 2014-07-13 NOTE — Telephone Encounter (Signed)
pts called with concerns regarding increased SOB Pt did take metolazone on 07/13/14 Weight still at 93 lb Took mucinex yesterday and experienced chest burning shortly after Oxygen is increased to 4L

## 2014-07-20 ENCOUNTER — Ambulatory Visit (HOSPITAL_COMMUNITY)
Admission: RE | Admit: 2014-07-20 | Discharge: 2014-07-20 | Disposition: A | Discharge status: Home / Self Care | Payer: Medicare Other | Source: Ambulatory Visit | Attending: Cardiology | Admitting: Cardiology

## 2014-07-20 ENCOUNTER — Encounter (HOSPITAL_COMMUNITY): Payer: Self-pay

## 2014-07-20 VITALS — BP 103/54 | HR 70 | Resp 20 | Wt 94.2 lb

## 2014-07-20 DIAGNOSIS — I38 Endocarditis, valve unspecified: Secondary | ICD-10-CM

## 2014-07-20 DIAGNOSIS — N183 Chronic kidney disease, stage 3 (moderate): Secondary | ICD-10-CM

## 2014-07-20 DIAGNOSIS — I5032 Chronic diastolic (congestive) heart failure: Secondary | ICD-10-CM | POA: Diagnosis not present

## 2014-07-20 LAB — BASIC METABOLIC PANEL
ANION GAP: 15 (ref 5–15)
BUN: 135 mg/dL — AB (ref 6–20)
CALCIUM: 9 mg/dL (ref 8.9–10.3)
CO2: 31 mmol/L (ref 22–32)
CREATININE: 2.04 mg/dL — AB (ref 0.44–1.00)
Chloride: 90 mmol/L — ABNORMAL LOW (ref 101–111)
GFR calc non Af Amer: 19 mL/min — ABNORMAL LOW (ref >60)
GFR, EST AFRICAN AMERICAN: 22 mL/min — AB (ref >60)
GLUCOSE: 189 mg/dL — AB (ref 70–99)
Potassium: 3.5 mmol/L (ref 3.5–5.1)
SODIUM: 136 mmol/L (ref 135–145)

## 2014-07-20 MED ORDER — METOLAZONE 2.5 MG PO TABS: 2.5000 mg | ORAL_TABLET | ORAL | Status: AC

## 2014-07-20 NOTE — Patient Instructions (Signed)
INCREASE Metolazone to every other day.  Return in 1-2 weeks for repeat lab to check on kidney function.  Follow up 1 month.  Do the following things EVERYDAY: 1) Weigh yourself in the morning before breakfast. Write it down and keep it in a log. 2) Take your medicines as prescribed 3) Eat low salt foods-Limit salt (sodium) to 2000 mg per day.  4) Stay as active as you can everyday 5) Limit all fluids for the day to less than 2 liters

## 2014-07-21 NOTE — Progress Notes (Signed)
Patient ID: Janice Petersen, female   DOB: 09/26/1915, 81 y.o.   MRN: [id number] PCP: Stephanie Acre  Janice Petersen is a 64 yo with history of diastolic CHF, valvular heart disease, CAD, and chronic atrial fibrillation presents for cardiology followup.  She used to live in [facility] and was followed by a cardiologist down there.  She has moved to [facility] to be nearer her family (currently living with one of her sons).  She had a stent placed in her RCA in 2004.  She has had a long history of valvular disease, last echo showed moderate to severe MR and moderate to severe TR with severe pulmonary hypertension.  She has a history of diastolic CHF.  She has been in atrial fibrillation since 2003 on warfarin.  Takes lasix 120 bid and metolazone 3x/week. She continues to feel a sense of fullness when she tries to eat so has a poor appetite.  It seems that sometimes food also gets hung up in her throat.  This is not predictable.  Her weight fluctuates.  Now using 4 L oxygen at home, this seems to help.  Not walking outside the house now.  She is not short of breath typically when walking around in the house.  Weight stable on our scales.   Labs (2/13): K 4.1, creatinine 1.1 => 1.4, proBNP 683, LDL 54, HDL 44 Labs (4/13): Creatinine 1.87, K 4.9, BNP 486 Labs (6/13): K 3.6, creatinine 1.4 Labs (2/14): K 3.8, creatinine 1.3, BNP 446 Labs (6/14): K 3.6, creatinine 1.4, BNP 733 Labs (8/14): K 3.8, creatinine 1.3, BNP 721, LDL 49, HDl 52 Labs (11/14): K 4.4, creatinine 1.7 => 2.4 => 1.5, BNP 549 Labs (12/14): K 4.3, creatinine 1.5 Labs (4/15): K 3.8, BUN 104, creatinine 2.3 Labs (4/15): K 4.2, BUN 76, creatinine 1.57, pro-BNP 5119 Labs (07/30/13) K 3.3 Creatinine 1.5  Labs (6/15): K 3.1, creatinine 1.41 Labs (11/06/13): K 3.0 Creatinine  1.6 Pro BNP 203  Labs (9/15): K 4.1, creatinine 1.7 => 1.4, BUN 74 => 47, BNP 2598 Labs (10/15): K 5, creatinine 1.48 Labs (11/15): K 3.6, creatinine 1.48, HCT 40.4 Labs (10/15): K 3.5,  creatinine 1.7 Labs (3/16): K 3.9, creatinine 1.44, BUN 517  PMH: 1. Diastolic CHF: Echo (00/17) with EF 65%, mild AI, moderate to severe MR, moderate to severe TR, PA systolic pressure 84 mmHg.  She has been on home oxygen since 1/13.  2. Chronic atrial fibrillation since 2003. 3. PMR 4. H/o appendectomy 5. TAH 6. Renal artery stenosis: 50% left renal artery stenosis found on angiogram in 8/02.  7. Hypothyroidism 8. MRSA sepsis in 2006 9. Hyperlipidemia 10. H/o PE 11. CAD: Cypher DES to proximal RCA in 2/04.  Last Lexiscan myoview in 2011 showed EF 67%, normal.  12. Valvular heart disease: Last echo in 10/12 showed moderate to severe MR, moderate to severe TR.  13. Pulmonary hypertension: Suspect secondary to elevated left atrial pressure as well as probable reactive pulmonary vascular changes.  14. CKD 15. Squamous cell CA right leg  SH: Widow. Moved from [place] to [place] and living with her son.  Has 3 sons.  Former smoker (quit years ago).  Rare ETOH.   FH: Mother with CHF at 62, father with multiple myeloma.   ROS: All systems reviewed and negative except as per HPI.   Current Outpatient Prescriptions  Medication Sig Dispense Refill  . albuterol (PROAIR HFA) 108 (90 BASE) MCG/ACT inhaler Inhale 2 puffs into the lungs every 6 (six)  hours as needed for wheezing or shortness of breath. 1 Inhaler 0  . bisoprolol (ZEBETA) 10 MG tablet Take 1 tablet (10 mg total) by mouth daily. 90 tablet 0  . Calcium Carbonate-Vitamin D (CALCIUM 500 + D PO) Take by mouth daily.    . Coenzyme Q10 (COQ10 PO) Take 400 mg by mouth.    . furosemide (LASIX) 40 MG tablet TAKE 3 TABLETS BY MOUTH IN THE MORNING AND 2 TABLETS IN THE EVENING AS DIRECTED (Patient taking differently: TAKE 3 TABLETS BY MOUTH IN THE MORNING AND 3 TABLETS IN THE EVENING AS DIRECTED) 150 tablet 3  . isosorbide mononitrate (IMDUR) 30 MG 24 hr tablet TAKE ONE TABLET DAILY    . levothyroxine (SYNTHROID, LEVOTHROID) 50 MCG  tablet Take 50 mcg by mouth daily before breakfast.    . lovastatin (MEVACOR) 20 MG tablet Take 1 tablet (20 mg total) by mouth daily. 90 tablet 3  . metolazone (ZAROXOLYN) 2.5 MG tablet Take 1 tablet (2.5 mg total) by mouth every other day. Hold if your weight is less than 88 lbs. 15 tablet 6  . NON FORMULARY 3 L daily. 3 Liters of oxygen    . potassium chloride (K-DUR,KLOR-CON) 10 MEQ tablet Take 5 tablets (50 mEq total) by mouth 2 (two) times daily. Take an extra 40 meq when you take Metolazone (Patient taking differently: Take 50 mEq by mouth 2 (two) times daily. Take an extra 20 meq when you take Metolazone) 300 tablet 3  . Probiotic Product (ALIGN PO) Take by mouth daily.    Marland Kitchen warfarin (COUMADIN) 1 MG tablet TAKE AS DIRECTED BY ANTICOAGULATION CLINIC 60 tablet 3   No current facility-administered medications for this encounter.    BP 103/54 mmHg  Pulse 70  Resp 20  Wt 94 lb 4 oz (42.752 kg)  SpO2 95% General: frail, NAD arrived in a wheelchair with her son Neck: JVP 14-16 cm, no thyromegaly or thyroid nodule.  Lungs: Clear.  On home oxygen CV: Nondisplaced PMI.  Heart irregular S1/S2, no S3/S4, 3/6 HSM LLSB/apex.  1+ ankle edema.  No carotid bruit.   Abdomen: Soft, nontender, no hepatosplenomegaly, mild distention.     Neurologic: Alert and oriented x 3.  Psych: Normal affect. Extremities: No clubbing or cyanosis.   Assessment/Plan:  1. Atrial fibrillation  Chronic atrial fibrillation.  Continue warfarin. No recent falls.   2. CAD  Stable with no chest pain. Continue warfarin (no indication to add ASA), statin, beta blocker. Diastolic CHF, chronic  NYHA class IIIb symptoms. Volume status slightly up.  - Continue Lasix to 120 mg bid.   - Increase metolazone to every other day.  She will need to take an extra 20 mEq KCl on metolazone days.   - BMET today and repeat in 10 days.  Pulmonary hypertension  Probably secondary to diastolic LV failure with elevated LA pressure and  reactive pulmonary artery changes.  Valvular heart disease  Moderate to severe MR and TR, likely significant contributors to diastolic CHF and pulmonary HTN. Not an operative candidate.  CKD Gradually worsening renal function.  Will repeat BMET today.  We do not have a lot of options, will increase diuretics to keep her breathing comfortably but cardiorenal syndrome is going to be difficult to control.    [name]  MD  07/21/2014

## 2014-07-23 ENCOUNTER — Other Ambulatory Visit: Payer: Self-pay | Admitting: Cardiology

## 2014-07-23 ENCOUNTER — Other Ambulatory Visit (HOSPITAL_COMMUNITY): Payer: Self-pay | Admitting: Adult Health

## 2014-08-02 ENCOUNTER — Ambulatory Visit (INDEPENDENT_AMBULATORY_CARE_PROVIDER_SITE_OTHER): Payer: Medicare Other | Admitting: *Deleted

## 2014-08-02 ENCOUNTER — Other Ambulatory Visit (INDEPENDENT_AMBULATORY_CARE_PROVIDER_SITE_OTHER): Payer: Medicare Other | Admitting: *Deleted

## 2014-08-02 DIAGNOSIS — I4891 Unspecified atrial fibrillation: Secondary | ICD-10-CM

## 2014-08-02 DIAGNOSIS — I5032 Chronic diastolic (congestive) heart failure: Secondary | ICD-10-CM | POA: Diagnosis not present

## 2014-08-02 DIAGNOSIS — Z5181 Encounter for therapeutic drug level monitoring: Secondary | ICD-10-CM

## 2014-08-02 DIAGNOSIS — Z7901 Long term (current) use of anticoagulants: Secondary | ICD-10-CM | POA: Diagnosis not present

## 2014-08-02 LAB — POCT INR: INR: 4.6

## 2014-08-02 LAB — BASIC METABOLIC PANEL
BUN: 127 mg/dL — AB (ref 6–23)
CHLORIDE: 89 meq/L — AB (ref 96–112)
CO2: 33 mEq/L — ABNORMAL HIGH (ref 19–32)
Calcium: 9.9 mg/dL (ref 8.4–10.5)
Creatinine, Ser: 1.92 mg/dL — ABNORMAL HIGH (ref 0.40–1.20)
GFR: 25.67 mL/min — ABNORMAL LOW (ref >60.00)
Glucose, Bld: 111 mg/dL — ABNORMAL HIGH (ref 70–99)
Potassium: 3.7 mEq/L (ref 3.5–5.1)
Sodium: 133 mEq/L — ABNORMAL LOW (ref 135–145)

## 2014-08-02 NOTE — Addendum Note (Signed)
Addended by: Eulis Foster on: 08/02/2014 01:12 PM   Modules accepted: Orders

## 2014-08-06 ENCOUNTER — Other Ambulatory Visit (HOSPITAL_COMMUNITY): Payer: Self-pay | Admitting: Internal Medicine

## 2014-08-10 LAB — PROTIME-INR: INR: 3.8 — AB (ref 0.9–1.1)

## 2014-08-11 ENCOUNTER — Ambulatory Visit (INDEPENDENT_AMBULATORY_CARE_PROVIDER_SITE_OTHER): Payer: Medicare Other | Admitting: Cardiology

## 2014-08-11 DIAGNOSIS — I4891 Unspecified atrial fibrillation: Secondary | ICD-10-CM

## 2014-08-11 DIAGNOSIS — Z5181 Encounter for therapeutic drug level monitoring: Secondary | ICD-10-CM

## 2014-08-16 ENCOUNTER — Ambulatory Visit (INDEPENDENT_AMBULATORY_CARE_PROVIDER_SITE_OTHER): Payer: Medicare Other | Admitting: *Deleted

## 2014-08-16 DIAGNOSIS — I4891 Unspecified atrial fibrillation: Secondary | ICD-10-CM | POA: Diagnosis not present

## 2014-08-16 DIAGNOSIS — Z7901 Long term (current) use of anticoagulants: Secondary | ICD-10-CM

## 2014-08-16 DIAGNOSIS — Z5181 Encounter for therapeutic drug level monitoring: Secondary | ICD-10-CM

## 2014-08-16 LAB — POCT INR: INR: 3.1

## 2014-08-19 ENCOUNTER — Ambulatory Visit (HOSPITAL_COMMUNITY)
Admission: RE | Admit: 2014-08-19 | Discharge: 2014-08-19 | Disposition: A | Discharge status: Home / Self Care | Payer: Medicare Other | Source: Ambulatory Visit | Attending: Internal Medicine | Admitting: Internal Medicine

## 2014-08-19 ENCOUNTER — Encounter (HOSPITAL_COMMUNITY): Payer: Self-pay

## 2014-08-19 VITALS — BP 118/58 | HR 75 | Resp 18 | Wt 96.5 lb

## 2014-08-19 DIAGNOSIS — Z7901 Long term (current) use of anticoagulants: Secondary | ICD-10-CM | POA: Diagnosis not present

## 2014-08-19 DIAGNOSIS — I701 Atherosclerosis of renal artery: Secondary | ICD-10-CM | POA: Insufficient documentation

## 2014-08-19 DIAGNOSIS — Z87891 Personal history of nicotine dependence: Secondary | ICD-10-CM | POA: Insufficient documentation

## 2014-08-19 DIAGNOSIS — Z79899 Other long term (current) drug therapy: Secondary | ICD-10-CM | POA: Diagnosis not present

## 2014-08-19 DIAGNOSIS — I5032 Chronic diastolic (congestive) heart failure: Secondary | ICD-10-CM | POA: Diagnosis present

## 2014-08-19 DIAGNOSIS — N183 Chronic kidney disease, stage 3 (moderate): Secondary | ICD-10-CM | POA: Diagnosis not present

## 2014-08-19 DIAGNOSIS — E785 Hyperlipidemia, unspecified: Secondary | ICD-10-CM | POA: Diagnosis not present

## 2014-08-19 DIAGNOSIS — Z86711 Personal history of pulmonary embolism: Secondary | ICD-10-CM | POA: Insufficient documentation

## 2014-08-19 DIAGNOSIS — I482 Chronic atrial fibrillation, unspecified: Secondary | ICD-10-CM

## 2014-08-19 DIAGNOSIS — Z9981 Dependence on supplemental oxygen: Secondary | ICD-10-CM | POA: Insufficient documentation

## 2014-08-19 DIAGNOSIS — I2510 Atherosclerotic heart disease of native coronary artery without angina pectoris: Secondary | ICD-10-CM | POA: Insufficient documentation

## 2014-08-19 DIAGNOSIS — I272 Other secondary pulmonary hypertension: Secondary | ICD-10-CM | POA: Diagnosis not present

## 2014-08-19 DIAGNOSIS — E039 Hypothyroidism, unspecified: Secondary | ICD-10-CM | POA: Diagnosis not present

## 2014-08-19 MED ORDER — TORSEMIDE 20 MG PO TABS: 40.0000 mg | ORAL_TABLET | Freq: Two times a day (BID) | ORAL | Status: DC

## 2014-08-19 NOTE — Progress Notes (Signed)
Patient ID: Janice Petersen, female   DOB: 1915/04/26, 81 y.o.   MRN: [id number] PCP: Janice Petersen  Janice Petersen is a 81 yo with history of diastolic CHF, valvular heart disease, CAD, and chronic atrial fibrillation presents for cardiology followup.  She used to live in [facility] and was followed by a cardiologist down there.  She has moved to [facility] to be nearer her family (currently living with one of her sons).  She had a stent placed in her RCA in 2004.  She has had a long history of valvular disease, last echo showed moderate to severe MR and moderate to severe TR with severe pulmonary hypertension.  She has a history of diastolic CHF.  She has been in atrial fibrillation since 2003 on warfarin.  Follow up for Heart Failure: Last visit metolazone increased to every other day. Weight at home 92-94 pounds.Has been going up.  Having increased abdominal bloating. Ongoing daily oxygen requirements. Poor appetite. Drinking Ensure 3 times a day.  Unable to eat much. Drinking 2 liters per day.    Labs (2/13): K 4.1, creatinine 1.1 => 1.4, proBNP 683, LDL 54, HDL 44 Labs (4/13): Creatinine 1.87, K 4.9, BNP 486 Labs (6/13): K 3.6, creatinine 1.4 Labs (2/14): K 3.8, creatinine 1.3, BNP 446 Labs (6/14): K 3.6, creatinine 1.4, BNP 733 Labs (8/14): K 3.8, creatinine 1.3, BNP 721, LDL 49, HDl 52 Labs (11/14): K 4.4, creatinine 1.7 => 2.4 => 1.5, BNP 549 Labs (12/14): K 4.3, creatinine 1.5 Labs (4/15): K 3.8, BUN 104, creatinine 2.3 Labs (4/15): K 4.2, BUN 76, creatinine 1.57, pro-BNP 5119 Labs (07/30/13) K 3.3 Creatinine 1.5  Labs (6/15): K 3.1, creatinine 1.41 Labs (11/06/13): K 3.0 Creatinine  1.6 Pro BNP 203  Labs (9/15): K 4.1, creatinine 1.7 => 1.4, BUN 74 => 47, BNP 2598 Labs (10/15): K 5, creatinine 1.48 Labs (11/15): K 3.6, creatinine 1.48, HCT 40.4 Labs (10/15): K 3.5, creatinine 1.7 Labs (07/20/2014) : K 3.5 Creatinine 2.04  Labs (08/02/2014) : K 3.7 Creatinine 1.92   PMH: 1. Diastolic CHF: Echo  (81/27) with EF 65%, mild AI, moderate to severe MR, moderate to severe TR, PA systolic pressure 84 mmHg.  She has been on home oxygen since 1/13.  2. Chronic atrial fibrillation since 2003. 3. PMR 4. H/o appendectomy 5. TAH 6. Renal artery stenosis: 50% left renal artery stenosis found on angiogram in 8/02.  7. Hypothyroidism 8. MRSA sepsis in 2006 9. Hyperlipidemia 10. H/o PE 11. CAD: Cypher DES to proximal RCA in 2/04.  Last Lexiscan myoview in 2011 showed EF 67%, normal.  12. Valvular heart disease: Last echo in 10/12 showed moderate to severe MR, moderate to severe TR.  13. Pulmonary hypertension: Suspect secondary to elevated left atrial pressure as well as probable reactive pulmonary vascular changes.  14. CKD 15. Squamous cell CA right leg  SH: Widow. Moved from [place] to [place] and living with her son.  Has 3 sons.  Former smoker (quit years ago).  Rare ETOH.   FH: Mother with CHF at 42, father with multiple myeloma.   ROS: All systems reviewed and negative except as per HPI.   Current Outpatient Prescriptions  Medication Sig Dispense Refill  . albuterol (PROAIR HFA) 108 (90 BASE) MCG/ACT inhaler Inhale 2 puffs into the lungs every 6 (six) hours as needed for wheezing or shortness of breath. 1 Inhaler 0  . bisoprolol (ZEBETA) 10 MG tablet TAKE 1 TABLET BY MOUTH EVERY DAY 90 tablet 0  .  Calcium Carbonate-Vitamin D (CALCIUM 500 + D PO) Take by mouth daily.    . Coenzyme Q10 (COQ10 PO) Take 400 mg by mouth.    . furosemide (LASIX) 40 MG tablet Take 120 mg by mouth 2 (two) times daily.    . isosorbide mononitrate (IMDUR) 30 MG 24 hr tablet TAKE ONE TABLET DAILY    . levothyroxine (SYNTHROID, LEVOTHROID) 50 MCG tablet Take 50 mcg by mouth daily before breakfast.    . lovastatin (MEVACOR) 20 MG tablet Take 1 tablet (20 mg total) by mouth daily. 90 tablet 3  . metolazone (ZAROXOLYN) 2.5 MG tablet Take 1 tablet (2.5 mg total) by mouth every other day. Hold if your weight is  less than 88 lbs. 15 tablet 6  . NON FORMULARY 3 L daily. 3 Liters of oxygen    . potassium chloride (K-DUR,KLOR-CON) 10 MEQ tablet Take 5 tablets (50 mEq total) by mouth 2 (two) times daily. Take an extra 40 meq when you take Metolazone (Patient taking differently: Take 50 mEq by mouth 2 (two) times daily. Take an extra 20 meq when you take Metolazone) 300 tablet 3  . Probiotic Product (ALIGN PO) Take by mouth daily.    Marland Kitchen warfarin (COUMADIN) 1 MG tablet TAKE AS DIRECTED BY ANTICOAGULATION CLINIC 60 tablet 3   No current facility-administered medications for this encounter.    BP 118/58 mmHg  Pulse 75  Resp 18  Wt 96 lb 8 oz (43.772 kg)  SpO2 95% General: frail, NAD arrived in a wheelchair with her son Neck: JVP hard to see as neck stiff looks like to jaw, no thyromegaly or thyroid nodule.  Lungs: Clear.  On home oxygen CV: Nondisplaced PMI.  Heart irregular S1/S2, no S3/S4, 2/6 HSM LLSB/apex.  No edema.  No carotid bruit.   Abdomen: Soft, nontender, no hepatosplenomegaly, ++ distention.     Neurologic: Alert and oriented x 3.  Psych: Normal affect. Extremities: No clubbing or cyanosis. R and LLE 1+ edema Assessment/Plan:  1. Atrial fibrillation  Chronic atrial fibrillation.   Rate controlled. Continue warfarin. No recent falls.   2. CAD  Stable with no chest pain. Continue warfarin (no indication to add ASA), statin, beta blocker. Diastolic CHF, chronic  NYHA class IIIb symptoms. Volume status elevated. Stop lasix and start 40 mg torsemide twice a day. Will continue metolazone every other day Pulmonary hypertension  Probably secondary to diastolic LV failure with elevated LA pressure and reactive pulmonary artery changes.  Valvular heart disease  Moderate to severe MR and TR, likely significant contributors to diastolic CHF and pulmonary HTN. Not an operative candidate.  CKD Stage III Check BMET next week.   Follow up in 1 week with BMET.   CLEGG,AMY  NP-C  08/19/2014

## 2014-08-19 NOTE — Patient Instructions (Addendum)
STOP lasix.  START Torsemide 40 mg (2 tablets) twice daily. Prescription sent to [facility] [place]  Follow up 1 week with lab work.  Do the following things EVERYDAY: 1) Weigh yourself in the morning before breakfast. Write it down and keep it in a log. 2) Take your medicines as prescribed 3) Eat low salt foods-Limit salt (sodium) to 2000 mg per day.  4) Stay as active as you can everyday 5) Limit all fluids for the day to less than 2 liters

## 2014-08-23 ENCOUNTER — Other Ambulatory Visit (HOSPITAL_COMMUNITY): Payer: Self-pay

## 2014-08-23 DIAGNOSIS — I5022 Chronic systolic (congestive) heart failure: Secondary | ICD-10-CM

## 2014-08-23 MED ORDER — BISOPROLOL FUMARATE 10 MG PO TABS: 10.0000 mg | ORAL_TABLET | Freq: Every day | ORAL | Status: AC

## 2014-08-23 MED ORDER — ISOSORBIDE MONONITRATE ER 30 MG PO TB24: 30.0000 mg | ORAL_TABLET | Freq: Every day | ORAL | Status: AC

## 2014-08-23 MED ORDER — LOVASTATIN 20 MG PO TABS: 20.0000 mg | ORAL_TABLET | Freq: Every day | ORAL | Status: AC

## 2014-08-25 ENCOUNTER — Telehealth (HOSPITAL_COMMUNITY): Payer: Self-pay | Admitting: Cardiology

## 2014-08-25 MED ORDER — TORSEMIDE 20 MG PO TABS: 60.0000 mg | ORAL_TABLET | Freq: Two times a day (BID) | ORAL | Status: DC

## 2014-08-25 NOTE — Telephone Encounter (Signed)
Patients daughter called with concerns regarding med changes and weight gain States patient was recently changed from Lasix to Torsemide and since change patients weight has not dropped below 93-94 lbs (normally 88lbs) Pt does have increased SOB (gets very winded walking to restroom), abdominal swelling, and pt did have a difficult time resting last night due to SOB  Pt does take: metoloazone 2.5 mg QOD, Torsemide 40 mg BID, KCL 50 MEQ BID and additional 40 MEQ on metolazone days 08/02/14   cr- 1.92  BUN 127 K- 3.7  Advised daughter, no sliding scale diuretic order/note in last office visit so I did not feel comfortable with increasing meds or giving extra doses of meds at this time  Will review with provider and call back as soon as I have orders   PLEASE ADVISE

## 2014-08-25 NOTE — Telephone Encounter (Signed)
Per VO Dr.McLean ok Torsemide to 60mg  BID  Juliann Pulse aware and voiced understanding

## 2014-08-26 ENCOUNTER — Ambulatory Visit (HOSPITAL_COMMUNITY)
Admission: RE | Admit: 2014-08-26 | Discharge: 2014-08-26 | Disposition: A | Discharge status: Home / Self Care | Payer: Medicare Other | Source: Ambulatory Visit | Attending: Internal Medicine | Admitting: Internal Medicine

## 2014-08-26 VITALS — BP 110/68 | HR 71 | Wt 97.2 lb

## 2014-08-26 DIAGNOSIS — I4891 Unspecified atrial fibrillation: Secondary | ICD-10-CM

## 2014-08-26 DIAGNOSIS — I5032 Chronic diastolic (congestive) heart failure: Secondary | ICD-10-CM | POA: Diagnosis not present

## 2014-08-26 DIAGNOSIS — N184 Chronic kidney disease, stage 4 (severe): Secondary | ICD-10-CM

## 2014-08-26 LAB — COMPREHENSIVE METABOLIC PANEL
ALBUMIN: 3.5 g/dL (ref 3.5–5.0)
ALT: 25 U/L (ref 14–54)
AST: 38 U/L (ref 15–41)
Alkaline Phosphatase: 181 U/L — ABNORMAL HIGH (ref 38–126)
Anion gap: 13 (ref 5–15)
BILIRUBIN TOTAL: 1.2 mg/dL (ref 0.3–1.2)
BUN: 152 mg/dL — ABNORMAL HIGH (ref 6–20)
CO2: 29 mmol/L (ref 22–32)
Calcium: 9.4 mg/dL (ref 8.9–10.3)
Chloride: 92 mmol/L — ABNORMAL LOW (ref 101–111)
Creatinine, Ser: 2.14 mg/dL — ABNORMAL HIGH (ref 0.44–1.00)
GFR calc Af Amer: 21 mL/min — ABNORMAL LOW (ref >60)
GFR calc non Af Amer: 18 mL/min — ABNORMAL LOW (ref >60)
Glucose, Bld: 98 mg/dL (ref 65–99)
POTASSIUM: 4.3 mmol/L (ref 3.5–5.1)
Sodium: 134 mmol/L — ABNORMAL LOW (ref 135–145)
Total Protein: 6.8 g/dL (ref 6.5–8.1)

## 2014-08-26 LAB — PROTIME-INR
INR: 3.85 — ABNORMAL HIGH (ref 0.00–1.49)
Prothrombin Time: 36.9 seconds — ABNORMAL HIGH (ref 11.6–15.2)

## 2014-08-26 LAB — BRAIN NATRIURETIC PEPTIDE: B Natriuretic Peptide: 364.9 pg/mL — ABNORMAL HIGH (ref 0.0–100.0)

## 2014-08-26 MED ORDER — TORSEMIDE 20 MG PO TABS: 80.0000 mg | ORAL_TABLET | Freq: Two times a day (BID) | ORAL | Status: DC

## 2014-08-26 NOTE — Patient Instructions (Signed)
Labs today  Increase Torsemide to 80 mg (4 tabs) Twice daily, if doesn't feel like fluid is coming off and weight is not down in AM please give [name] a call, [phone] opt 5

## 2014-08-26 NOTE — Progress Notes (Signed)
Patient ID: Janice Petersen, female   DOB: 09/08/1915, 81 y.o.   MRN: [id number] PCP: Stephanie Acre  Janice Petersen is a 81 yo with history of diastolic CHF, valvular heart disease, CAD, and chronic atrial fibrillation presents for cardiology followup.  She used to live in [facility] and was followed by a cardiologist down there.  She has moved to [facility] to be nearer her family (currently living with one of her sons).  She had a stent placed in her RCA in 2004.  She has had a long history of valvular disease, last echo showed moderate to severe MR and moderate to severe TR with severe pulmonary hypertension.  She has a history of diastolic CHF.  She has been in atrial fibrillation since 2003 on warfarin.  Follow up for Heart Failure: Last visit lasix was stopped and she was started on torsemide 40 mg twice a day. Yesterday she was instructed to increase torsemide to 60 mg twice a day. Weight at home up to 95 pounds. She remains on 4 liters [place] continuously.     Labs (2/13): K 4.1, creatinine 1.1 => 1.4, proBNP 683, LDL 54, HDL 44 Labs (4/13): Creatinine 1.87, K 4.9, BNP 486 Labs (6/13): K 3.6, creatinine 1.4 Labs (2/14): K 3.8, creatinine 1.3, BNP 446 Labs (6/14): K 3.6, creatinine 1.4, BNP 733 Labs (8/14): K 3.8, creatinine 1.3, BNP 721, LDL 49, HDl 52 Labs (11/14): K 4.4, creatinine 1.7 => 2.4 => 1.5, BNP 549 Labs (12/14): K 4.3, creatinine 1.5 Labs (4/15): K 3.8, BUN 104, creatinine 2.3 Labs (4/15): K 4.2, BUN 76, creatinine 1.57, pro-BNP 5119 Labs (07/30/13) K 3.3 Creatinine 1.5  Labs (6/15): K 3.1, creatinine 1.41 Labs (11/06/13): K 3.0 Creatinine  1.6 Pro BNP 203  Labs (9/15): K 4.1, creatinine 1.7 => 1.4, BUN 74 => 47, BNP 2598 Labs (10/15): K 5, creatinine 1.48 Labs (11/15): K 3.6, creatinine 1.48, HCT 40.4 Labs (10/15): K 3.5, creatinine 1.7 Labs (07/20/2014) : K 3.5 Creatinine 2.04  Labs (08/02/2014) : K 3.7 Creatinine 1.92   PMH: 1. Diastolic CHF: Echo (32/35) with EF 65%, mild AI, moderate to  severe MR, moderate to severe TR, PA systolic pressure 84 mmHg.  She has been on home oxygen since 1/13.  2. Chronic atrial fibrillation since 2003. 3. PMR 4. H/o appendectomy 5. TAH 6. Renal artery stenosis: 50% left renal artery stenosis found on angiogram in 8/02.  7. Hypothyroidism 8. MRSA sepsis in 2006 9. Hyperlipidemia 10. H/o PE 11. CAD: Cypher DES to proximal RCA in 2/04.  Last Lexiscan myoview in 2011 showed EF 67%, normal.  12. Valvular heart disease: Last echo in 10/12 showed moderate to severe MR, moderate to severe TR.  13. Pulmonary hypertension: Suspect secondary to elevated left atrial pressure as well as probable reactive pulmonary vascular changes.  14. CKD 15. Squamous cell CA right leg  SH: Widow. Moved from [place] to [place] and living with her son.  Has 3 sons.  Former smoker (quit years ago).  Rare ETOH.   FH: Mother with CHF at 72, father with multiple myeloma.   ROS: All systems reviewed and negative except as per HPI.   Current Outpatient Prescriptions  Medication Sig Dispense Refill  . albuterol (PROAIR HFA) 108 (90 BASE) MCG/ACT inhaler Inhale 2 puffs into the lungs every 6 (six) hours as needed for wheezing or shortness of breath. 1 Inhaler 0  . bisoprolol (ZEBETA) 10 MG tablet Take 1 tablet (10 mg total) by mouth daily.  30 tablet 6  . Calcium Carbonate-Vitamin D (CALCIUM 500 + D PO) Take by mouth daily.    . Coenzyme Q10 (COQ10 PO) Take 400 mg by mouth.    . isosorbide mononitrate (IMDUR) 30 MG 24 hr tablet Take 1 tablet (30 mg total) by mouth daily. 30 tablet 6  . levothyroxine (SYNTHROID, LEVOTHROID) 50 MCG tablet Take 50 mcg by mouth daily before breakfast.    . loperamide (IMODIUM) 2 MG capsule Take 2 mg by mouth as needed for diarrhea or loose stools.    . lovastatin (MEVACOR) 20 MG tablet Take 1 tablet (20 mg total) by mouth daily. 30 tablet 6  . metolazone (ZAROXOLYN) 2.5 MG tablet Take 1 tablet (2.5 mg total) by mouth every other day.  Hold if your weight is less than 88 lbs. 15 tablet 6  . NON FORMULARY 3 L daily. 3 Liters of oxygen    . potassium chloride (K-DUR,KLOR-CON) 10 MEQ tablet Take 5 tablets (50 mEq total) by mouth 2 (two) times daily. Take an extra 40 meq when you take Metolazone (Patient taking differently: Take 50 mEq by mouth 2 (two) times daily. Take an extra 20 meq when you take Metolazone) 300 tablet 3  . Probiotic Product (ALIGN PO) Take by mouth daily.    Marland Kitchen torsemide (DEMADEX) 20 MG tablet Take 3 tablets (60 mg total) by mouth 2 (two) times daily. 120 tablet 3  . warfarin (COUMADIN) 1 MG tablet TAKE AS DIRECTED BY ANTICOAGULATION CLINIC 60 tablet 3   No current facility-administered medications for this encounter.    BP 110/68 mmHg  Pulse 71  Wt 97 lb 4 oz (44.112 kg)  SpO2 93% General: frail, NAD arrived in a wheelchair with her son Neck: JVP hard to see as neck stiff looks like to jaw, no thyromegaly or thyroid nodule.  Lungs: Clear.  On home oxygen 4 liters  CV: Nondisplaced PMI.  Heart irregular S1/S2, no S3/S4, 2/6 HSM LLSB/apex.  No edema.  No carotid bruit.   Abdomen: Soft, nontender, no hepatosplenomegaly, ++ distention.     Neurologic: Alert and oriented x 3.  Psych: Normal affect. Extremities: No clubbing or cyanosis. R and LLE 1-2+ edema Assessment/Plan:  1. Atrial fibrillation  Chronic atrial fibrillation.   Rate controlled. Continue warfarin. No recent falls.   2. CAD  Stable with no chest pain. Continue warfarin (no indication to add ASA), statin, beta blocker. Diastolic CHF, chronic  NYHA class IIIb symptoms. Volume status elevated. Increase torsemide to 80 mg twice a day. If no response will give 120 mg IV lasix + 5 mg metolazone in HF clinic tomorrow.   Check CMET BNP now.   Pulmonary hypertension  Probably secondary to diastolic LV failure with elevated LA pressure and reactive pulmonary artery changes.  Valvular heart disease  Moderate to severe MR and TR, likely significant  contributors to diastolic CHF and pulmonary HTN. Not an operative candidate.  CKD Stage III Check CMET today.    Follow up in 2 weeks.   CLEGG,AMY  NP-C  08/26/2014  Patient seen and examined with Darrick Grinder, NP. We discussed all aspects of the encounter. I agree with the assessment and plan as stated above.   She continues to be mildly volume overloaded. I suspect this may be more due to worsening renal failure. Agree with plan as stated above.   Shem Plemmons,MD 8:41 PM

## 2014-08-27 ENCOUNTER — Telehealth (HOSPITAL_COMMUNITY): Payer: Self-pay | Admitting: *Deleted

## 2014-08-27 NOTE — Telephone Encounter (Signed)
Janice Petersen called w/update on pt, she states pt's wt is 93 lb this AM, she was 94 lb yesterday AM so down 1 lb, she states pt was urinated ok yesterday evening, discussed with Dr Haroldine Laws, since wt is starting to come down will have pt continue Torsemide 80 mg Twice daily over weekend and f/u with DM on Monday.  [name] aware and verbalizes understanding, f/u appt sch for Mon 6/20 at 11:30

## 2014-08-30 ENCOUNTER — Other Ambulatory Visit (HOSPITAL_COMMUNITY): Payer: Self-pay | Admitting: *Deleted

## 2014-08-30 ENCOUNTER — Ambulatory Visit (HOSPITAL_COMMUNITY)
Admission: RE | Admit: 2014-08-30 | Discharge: 2014-08-30 | Disposition: A | Discharge status: Home / Self Care | Payer: Medicare Other | Source: Ambulatory Visit | Attending: Internal Medicine | Admitting: Internal Medicine

## 2014-08-30 ENCOUNTER — Ambulatory Visit (INDEPENDENT_AMBULATORY_CARE_PROVIDER_SITE_OTHER): Payer: Medicare Other | Admitting: Pharmacist

## 2014-08-30 ENCOUNTER — Encounter (HOSPITAL_COMMUNITY): Payer: Self-pay

## 2014-08-30 VITALS — BP 118/68 | HR 65 | Wt 95.5 lb

## 2014-08-30 DIAGNOSIS — I272 Other secondary pulmonary hypertension: Secondary | ICD-10-CM | POA: Insufficient documentation

## 2014-08-30 DIAGNOSIS — I2510 Atherosclerotic heart disease of native coronary artery without angina pectoris: Secondary | ICD-10-CM

## 2014-08-30 DIAGNOSIS — N184 Chronic kidney disease, stage 4 (severe): Secondary | ICD-10-CM

## 2014-08-30 DIAGNOSIS — Z5181 Encounter for therapeutic drug level monitoring: Secondary | ICD-10-CM

## 2014-08-30 DIAGNOSIS — I38 Endocarditis, valve unspecified: Secondary | ICD-10-CM

## 2014-08-30 DIAGNOSIS — I5032 Chronic diastolic (congestive) heart failure: Secondary | ICD-10-CM

## 2014-08-30 DIAGNOSIS — I270 Primary pulmonary hypertension: Secondary | ICD-10-CM

## 2014-08-30 DIAGNOSIS — I4891 Unspecified atrial fibrillation: Secondary | ICD-10-CM | POA: Insufficient documentation

## 2014-08-30 DIAGNOSIS — I482 Chronic atrial fibrillation, unspecified: Secondary | ICD-10-CM

## 2014-08-30 LAB — BASIC METABOLIC PANEL
Anion gap: 14 (ref 5–15)
BUN: 157 mg/dL — AB (ref 6–20)
CO2: 30 mmol/L (ref 22–32)
CREATININE: 2.29 mg/dL — AB (ref 0.44–1.00)
Calcium: 9.4 mg/dL (ref 8.9–10.3)
Chloride: 92 mmol/L — ABNORMAL LOW (ref 101–111)
GFR calc Af Amer: 19 mL/min — ABNORMAL LOW (ref >60)
GFR calc non Af Amer: 17 mL/min — ABNORMAL LOW (ref >60)
GLUCOSE: 182 mg/dL — AB (ref 65–99)
Potassium: 3.7 mmol/L (ref 3.5–5.1)
Sodium: 136 mmol/L (ref 135–145)

## 2014-08-30 MED ORDER — TORSEMIDE 100 MG PO TABS: 100.0000 mg | ORAL_TABLET | Freq: Two times a day (BID) | ORAL | Status: AC

## 2014-08-30 NOTE — Patient Instructions (Signed)
Increase Torsemide to 100 mg Twice daily, we have sent you in a prescription for 100 mg tablets   You have been referred to [facility], they will contact you  Your physician recommends that you schedule a follow-up appointment in: 2-3 weeks

## 2014-08-30 NOTE — Progress Notes (Signed)
Patient ID: Janice Petersen, female   DOB: Sep 16, 1915, 81 y.o.   MRN: [id number] PCP: Stephanie Acre  Janice Petersen is a 81 yo with history of diastolic CHF, valvular heart disease, CAD, and chronic atrial fibrillation presents for cardiology followup.  She used to live in [facility] and was followed by a cardiologist down there.  She has moved to [facility] to be nearer her family (currently living with one of her sons).  She had a stent placed in her RCA in 2004.  She has had a long history of valvular disease, last echo showed moderate to severe MR and moderate to severe TR with severe pulmonary hypertension.  She has a history of diastolic CHF.  She has been in atrial fibrillation since 2003 on warfarin.  Follow up for Heart Failure: She is now on torsemide 80 mg bid with KCl 50 bid.  She is not walking much and is short of breath just walking to the bathroom in her house.  No appetite.  BUN has risen significantly over time.  Stayed in bed all day yesterday.  Able to get to car today with one stop.  No chest pain, no lightheadedness or falls.  Nose bleed yesterday, INR high last week. Apparently INR did not get over to coumadin clinic.  Weight is down 2 lbs.   Labs (2/13): K 4.1, creatinine 1.1 => 1.4, proBNP 683, LDL 54, HDL 44 Labs (4/13): Creatinine 1.87, K 4.9, BNP 486 Labs (6/13): K 3.6, creatinine 1.4 Labs (2/14): K 3.8, creatinine 1.3, BNP 446 Labs (6/14): K 3.6, creatinine 1.4, BNP 733 Labs (8/14): K 3.8, creatinine 1.3, BNP 721, LDL 49, HDl 52 Labs (11/14): K 4.4, creatinine 1.7 => 2.4 => 1.5, BNP 549 Labs (12/14): K 4.3, creatinine 1.5 Labs (4/15): K 3.8, BUN 104, creatinine 2.3 Labs (4/15): K 4.2, BUN 76, creatinine 1.57, pro-BNP 5119 Labs (07/30/13) K 3.3 Creatinine 1.5  Labs (6/15): K 3.1, creatinine 1.41 Labs (11/06/13): K 3.0 Creatinine  1.6 Pro BNP 203  Labs (9/15): K 4.1, creatinine 1.7 => 1.4, BUN 74 => 47, BNP 2598 Labs (10/15): K 5, creatinine 1.48 Labs (11/15): K 3.6, creatinine 1.48,  HCT 40.4 Labs (10/15): K 3.5, creatinine 1.7 Labs (07/20/2014) : K 3.5 Creatinine 2.04  Labs (08/02/2014) : K 3.7 Creatinine 1.92  Labs (6/16): K 4.3, BUN 152, creatinine 0.10  PMH: 1. Diastolic CHF: Echo (27/25) with EF 65%, mild AI, moderate to severe MR, moderate to severe TR, PA systolic pressure 84 mmHg.  She has been on home oxygen since 1/13.  2. Chronic atrial fibrillation since 2003. 3. PMR 4. H/o appendectomy 5. TAH 6. Renal artery stenosis: 50% left renal artery stenosis found on angiogram in 8/02.  7. Hypothyroidism 8. MRSA sepsis in 2006 9. Hyperlipidemia 10. H/o PE 11. CAD: Cypher DES to proximal RCA in 2/04.  Last Lexiscan myoview in 2011 showed EF 67%, normal.  12. Valvular heart disease: Last echo in 10/12 showed moderate to severe MR, moderate to severe TR.  13. Pulmonary hypertension: Suspect secondary to elevated left atrial pressure as well as probable reactive pulmonary vascular changes.  14. CKD 15. Squamous cell CA right leg  SH: Widow. Moved from [place] to [place] and living with her son.  Has 3 sons.  Former smoker (quit years ago).  Rare ETOH.   FH: Mother with CHF at 16, father with multiple myeloma.   ROS: All systems reviewed and negative except as per HPI.   Current Outpatient Prescriptions  Medication Sig Dispense Refill  . albuterol (PROAIR HFA) 108 (90 BASE) MCG/ACT inhaler Inhale 2 puffs into the lungs every 6 (six) hours as needed for wheezing or shortness of breath. 1 Inhaler 0  . bisoprolol (ZEBETA) 10 MG tablet Take 1 tablet (10 mg total) by mouth daily. 30 tablet 6  . Calcium Carbonate-Vitamin D (CALCIUM 500 + D PO) Take by mouth daily.    . Coenzyme Q10 (COQ10 PO) Take 400 mg by mouth.    . isosorbide mononitrate (IMDUR) 30 MG 24 hr tablet Take 1 tablet (30 mg total) by mouth daily. 30 tablet 6  . levothyroxine (SYNTHROID, LEVOTHROID) 50 MCG tablet Take 50 mcg by mouth daily before breakfast.    . loperamide (IMODIUM) 2 MG capsule  Take 2 mg by mouth as needed for diarrhea or loose stools.    . metolazone (ZAROXOLYN) 2.5 MG tablet Take 1 tablet (2.5 mg total) by mouth every other day. Hold if your weight is less than 88 lbs. 15 tablet 6  . NON FORMULARY 3 L daily. 3 Liters of oxygen    . potassium chloride (K-DUR,KLOR-CON) 10 MEQ tablet Take 5 tablets (50 mEq total) by mouth 2 (two) times daily. Take an extra 40 meq when you take Metolazone (Patient taking differently: Take 50 mEq by mouth 2 (two) times daily. Take an extra 20 meq when you take Metolazone) 300 tablet 3  . Probiotic Product (ALIGN PO) Take by mouth daily.    Marland Kitchen torsemide (DEMADEX) 100 MG tablet Take 1 tablet (100 mg total) by mouth 2 (two) times daily. 60 tablet 3  . warfarin (COUMADIN) 1 MG tablet TAKE AS DIRECTED BY ANTICOAGULATION CLINIC 60 tablet 3  . lovastatin (MEVACOR) 20 MG tablet Take 1 tablet (20 mg total) by mouth daily. 30 tablet 6   No current facility-administered medications for this encounter.    BP 118/68 mmHg  Pulse 65  Wt 95 lb 8 oz (43.319 kg)  SpO2 96% General: frail, NAD  Neck: JVP 12-14 cm, no thyromegaly or thyroid nodule.  Lungs: Crackles at bases bilaterally.  On home oxygen 4 liters  CV: Nondisplaced PMI.  Heart irregular S1/S2, no S3/S4, 2/6 HSM LLSB/apex.  No edema.  No carotid bruit.   Abdomen: Soft, nontender, no hepatosplenomegaly, mild distention.     Neurologic: Alert and oriented x 3.  Psych: Normal affect. Extremities: No clubbing or cyanosis. R and LLE 1-2+ edema Assessment/Plan:  1. Atrial fibrillation  Chronic atrial fibrillation.  Rate controlled. Continue warfarin => needs to be adjusted based on INR from last week, will send to coumadin clinic. No recent falls.   2. CAD  Stable with no chest pain. Continue warfarin (no indication to add ASA), statin, beta blocker. Diastolic CHF, chronic  NYHA class IIIb symptoms. Volume status remains elevated. Increase torsemide to 100 mg twice a day. Continue metolazone  every other day.   - BMET today and again at followup in 2 wks.  - She is nearing end-stage CHF.  We discussed home hospice today, and I will make a referral.  Pulmonary hypertension  Probably secondary to diastolic LV failure with elevated LA pressure and reactive pulmonary artery changes.  Valvular heart disease  Moderate to severe MR and TR, likely significant contributors to diastolic CHF and pulmonary HTN. Not an operative candidate.  CKD Stage IV Markedly high BUN, cardiorenal syndrome.  As above, nearing end stage.  Not a dialysis candidate.  We discussed her prognosis extensively today and will make  referral to home hospice.     Follow up in 2 weeks.   [name]   08/30/2014

## 2014-08-31 ENCOUNTER — Telehealth (HOSPITAL_COMMUNITY): Payer: Self-pay

## 2014-08-31 NOTE — Telephone Encounter (Signed)
[facility] called to ask if attending MD with their service could sign DNR form for patient.  VO given this was ok.  Renee Pain

## 2014-09-01 ENCOUNTER — Telehealth (HOSPITAL_COMMUNITY): Payer: Self-pay | Admitting: *Deleted

## 2014-09-01 NOTE — Telephone Encounter (Signed)
Hospice nurse called stated pt is experiencing anxiety and shortness of breath she requests an order for ativan or xanax .  Nira Conn advised that the hospice doctor needs to prescribe that medication. Left VM on hospice nurse phone.

## 2014-09-03 ENCOUNTER — Telehealth (HOSPITAL_COMMUNITY): Payer: Self-pay

## 2014-09-03 NOTE — Telephone Encounter (Signed)
[facility] called reporting that patient called 911 this morning c/o chest pain and shortness of breath.  Patient had labored brething, sats 88% on oxygen concentrator.  Oxygen concentrator was downstairs, and patient was upstairs, with long O2 tubing.  EMS shortened tubing and put on their tank, patient sats up to 99% now.  Reporting L side very diminished.  Weight is stable.  Patient recently started hospice this week and does not want to go to hospital.  Advised to contact hospice to maybe give her something for comfort, and if patient wants she could come to ED to have CXR to eval dimishied L lung sounds.  Theadora Rama states she will talk with patient to see what she would like to do.  Renee Pain

## 2014-09-09 ENCOUNTER — Telehealth: Payer: Self-pay | Admitting: Cardiology

## 2014-09-09 NOTE — Telephone Encounter (Signed)
D/C picked up by [facility] home.-KDM

## 2014-09-09 NOTE — Telephone Encounter (Signed)
Dr.McLean signed d/c on pt called informed [place] [facility] #[phone] this is ready for pick up.

## 2014-09-10 DEATH — deceased

## 2014-09-14 ENCOUNTER — Encounter: Payer: Self-pay | Admitting: Family Medicine

## 2014-09-15 ENCOUNTER — Telehealth: Payer: Self-pay | Admitting: Cardiology

## 2014-09-15 NOTE — Telephone Encounter (Signed)
2nd copy of d/c dropped off [facility] will not accept mark through on #23 cause of death Spoke with Santiago Glad at [facility] she is aware Dr.McLean will not be back in the office until next Thursday 09/23/14 for this to get signed.

## 2014-09-16 ENCOUNTER — Telehealth: Payer: Self-pay | Admitting: Cardiology

## 2014-09-16 NOTE — Telephone Encounter (Signed)
Dr.McLean will be in the Office this afternoon Janice Petersen will be covering d/c taken to her for corrections to be made.

## 2014-09-17 ENCOUNTER — Telehealth: Payer: Self-pay | Admitting: Cardiology

## 2014-09-17 NOTE — Telephone Encounter (Signed)
Dr.McLean corrected d/c called [facility] at [phone] for pick up.

## 2014-09-21 ENCOUNTER — Encounter (HOSPITAL_COMMUNITY): Payer: Medicare Other

## 2014-10-30 ENCOUNTER — Other Ambulatory Visit (HOSPITAL_COMMUNITY): Payer: Self-pay | Admitting: Internal Medicine
